# Patient Record
Sex: Female | Born: 1985 | Race: Black or African American | Hispanic: No | Marital: Married | Smoking: Never smoker
Health system: Southern US, Community
[De-identification: ages and names within clinical notes are randomized; demographics above are authoritative.]

## PROBLEM LIST (undated history)

## (undated) DIAGNOSIS — K620 Anal polyp: Secondary | ICD-10-CM

## (undated) DIAGNOSIS — F329 Major depressive disorder, single episode, unspecified: Secondary | ICD-10-CM

## (undated) DIAGNOSIS — K621 Rectal polyp: Secondary | ICD-10-CM

## (undated) DIAGNOSIS — N920 Excessive and frequent menstruation with regular cycle: Secondary | ICD-10-CM

## (undated) DIAGNOSIS — G4700 Insomnia, unspecified: Secondary | ICD-10-CM

## (undated) DIAGNOSIS — IMO0001 Reserved for inherently not codable concepts without codable children: Secondary | ICD-10-CM

## (undated) DIAGNOSIS — D62 Acute posthemorrhagic anemia: Secondary | ICD-10-CM

## (undated) DIAGNOSIS — K635 Polyp of colon: Secondary | ICD-10-CM

## (undated) DIAGNOSIS — N946 Dysmenorrhea, unspecified: Secondary | ICD-10-CM

## (undated) HISTORY — DX: Dysmenorrhea, unspecified: N94.6

## (undated) HISTORY — PX: CERVICAL POLYPECTOMY: SHX88

## (undated) HISTORY — PX: COLONOSCOPY: SHX174

## (undated) HISTORY — DX: Rectal polyp: K62.1

## (undated) HISTORY — PX: TONSILLECTOMY: SUR1361

## (undated) HISTORY — DX: Anal polyp: K62.0

## (undated) HISTORY — DX: Excessive and frequent menstruation with regular cycle: N92.0

## (undated) HISTORY — DX: Insomnia, unspecified: G47.00

---
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
## 2006-04-10 ENCOUNTER — Ambulatory Visit: Payer: Self-pay | Admitting: Family Medicine

## 2006-05-02 ENCOUNTER — Ambulatory Visit: Payer: Self-pay | Admitting: Family Medicine

## 2006-08-07 ENCOUNTER — Ambulatory Visit: Payer: Self-pay | Admitting: Sports Medicine

## 2006-08-26 ENCOUNTER — Emergency Department (HOSPITAL_COMMUNITY): Admission: EM | Admit: 2006-08-26 | Discharge: 2006-08-26 | Payer: Self-pay | Admitting: Emergency Medicine

## 2006-11-06 ENCOUNTER — Ambulatory Visit: Payer: Self-pay | Admitting: Family Medicine

## 2006-12-06 DIAGNOSIS — N946 Dysmenorrhea, unspecified: Secondary | ICD-10-CM | POA: Insufficient documentation

## 2006-12-06 DIAGNOSIS — E663 Overweight: Secondary | ICD-10-CM | POA: Insufficient documentation

## 2006-12-06 DIAGNOSIS — J45909 Unspecified asthma, uncomplicated: Secondary | ICD-10-CM | POA: Insufficient documentation

## 2006-12-06 HISTORY — DX: Dysmenorrhea, unspecified: N94.6

## 2007-02-21 ENCOUNTER — Telehealth: Payer: Self-pay | Admitting: *Deleted

## 2007-02-22 ENCOUNTER — Ambulatory Visit: Payer: Self-pay | Admitting: Family Medicine

## 2007-03-26 ENCOUNTER — Encounter: Payer: Self-pay | Admitting: *Deleted

## 2007-04-17 ENCOUNTER — Ambulatory Visit: Payer: Self-pay | Admitting: Family Medicine

## 2007-07-24 ENCOUNTER — Ambulatory Visit: Payer: Self-pay | Admitting: Family Medicine

## 2007-07-25 ENCOUNTER — Encounter (INDEPENDENT_AMBULATORY_CARE_PROVIDER_SITE_OTHER): Payer: Self-pay | Admitting: Family Medicine

## 2007-07-25 ENCOUNTER — Ambulatory Visit: Payer: Self-pay | Admitting: Internal Medicine

## 2007-07-25 DIAGNOSIS — K620 Anal polyp: Secondary | ICD-10-CM | POA: Insufficient documentation

## 2007-07-25 DIAGNOSIS — K621 Rectal polyp: Secondary | ICD-10-CM

## 2007-07-25 HISTORY — DX: Anal polyp: K62.0

## 2007-07-29 ENCOUNTER — Encounter: Payer: Self-pay | Admitting: *Deleted

## 2007-07-29 LAB — CONVERTED CEMR LAB
BUN: 13 mg/dL (ref 6–23)
Basophils Absolute: 0 10*3/uL (ref 0.0–0.1)
Basophils Relative: 1 % (ref 0–1)
CO2: 25 meq/L (ref 19–32)
Calcium: 9.5 mg/dL (ref 8.4–10.5)
Chloride: 104 meq/L (ref 96–112)
Creatinine, Ser: 0.69 mg/dL (ref 0.40–1.20)
Eosinophils Absolute: 0.1 10*3/uL (ref 0.0–0.7)
Eosinophils Relative: 2 % (ref 0–5)
Glucose, Bld: 79 mg/dL (ref 70–99)
HCT: 39.8 % (ref 36.0–46.0)
Hemoglobin: 13.8 g/dL (ref 12.0–15.0)
Lymphocytes Relative: 36 % (ref 12–46)
Lymphs Abs: 2 10*3/uL (ref 0.7–3.3)
MCHC: 34.7 g/dL (ref 30.0–36.0)
MCV: 90.5 fL (ref 78.0–100.0)
Monocytes Absolute: 0.6 10*3/uL (ref 0.2–0.7)
Monocytes Relative: 11 % (ref 3–11)
Neutro Abs: 2.9 10*3/uL (ref 1.7–7.7)
Neutrophils Relative %: 51 % (ref 43–77)
Platelets: 375 10*3/uL (ref 150–400)
Potassium: 4.2 meq/L (ref 3.5–5.3)
RBC: 4.4 M/uL (ref 3.87–5.11)
RDW: 12.2 % (ref 11.5–14.0)
Sodium: 140 meq/L (ref 135–145)
TSH: 1.006 microintl units/mL (ref 0.350–5.50)
WBC: 5.7 10*3/uL (ref 4.0–10.5)

## 2007-08-02 ENCOUNTER — Telehealth (INDEPENDENT_AMBULATORY_CARE_PROVIDER_SITE_OTHER): Payer: Self-pay | Admitting: Family Medicine

## 2007-09-12 ENCOUNTER — Encounter (INDEPENDENT_AMBULATORY_CARE_PROVIDER_SITE_OTHER): Payer: Self-pay | Admitting: Family Medicine

## 2007-09-12 ENCOUNTER — Ambulatory Visit: Payer: Self-pay | Admitting: Family Medicine

## 2007-10-04 ENCOUNTER — Ambulatory Visit: Payer: Self-pay | Admitting: Family Medicine

## 2007-10-10 ENCOUNTER — Emergency Department (HOSPITAL_COMMUNITY): Admission: EM | Admit: 2007-10-10 | Discharge: 2007-10-10 | Payer: Self-pay | Admitting: *Deleted

## 2007-11-26 ENCOUNTER — Telehealth: Payer: Self-pay | Admitting: *Deleted

## 2007-11-27 ENCOUNTER — Ambulatory Visit: Payer: Self-pay | Admitting: Family Medicine

## 2007-12-20 ENCOUNTER — Telehealth: Payer: Self-pay | Admitting: *Deleted

## 2007-12-25 ENCOUNTER — Ambulatory Visit: Payer: Self-pay | Admitting: Gastroenterology

## 2007-12-26 ENCOUNTER — Encounter (INDEPENDENT_AMBULATORY_CARE_PROVIDER_SITE_OTHER): Payer: Self-pay | Admitting: Family Medicine

## 2007-12-26 ENCOUNTER — Ambulatory Visit: Payer: Self-pay | Admitting: Gastroenterology

## 2008-01-13 ENCOUNTER — Telehealth: Payer: Self-pay | Admitting: *Deleted

## 2008-04-17 ENCOUNTER — Telehealth: Payer: Self-pay | Admitting: *Deleted

## 2008-04-17 ENCOUNTER — Ambulatory Visit: Payer: Self-pay | Admitting: Family Medicine

## 2008-04-17 DIAGNOSIS — L219 Seborrheic dermatitis, unspecified: Secondary | ICD-10-CM | POA: Insufficient documentation

## 2008-04-21 ENCOUNTER — Telehealth: Payer: Self-pay | Admitting: *Deleted

## 2008-04-22 ENCOUNTER — Ambulatory Visit: Payer: Self-pay | Admitting: Family Medicine

## 2008-06-22 ENCOUNTER — Ambulatory Visit: Payer: Self-pay | Admitting: Family Medicine

## 2008-06-25 ENCOUNTER — Telehealth: Payer: Self-pay | Admitting: *Deleted

## 2008-06-29 ENCOUNTER — Ambulatory Visit: Payer: Self-pay | Admitting: Family Medicine

## 2008-10-09 DIAGNOSIS — G4700 Insomnia, unspecified: Secondary | ICD-10-CM

## 2008-10-09 HISTORY — DX: Insomnia, unspecified: G47.00

## 2008-11-18 IMAGING — CR DG CHEST 2V
2 series · 2 of 2 positions shown · non-contrast
Comparison: None available.

CLINICAL DATA: Chest pain. 
 CHEST - 2 VIEW:

[w chest pa]
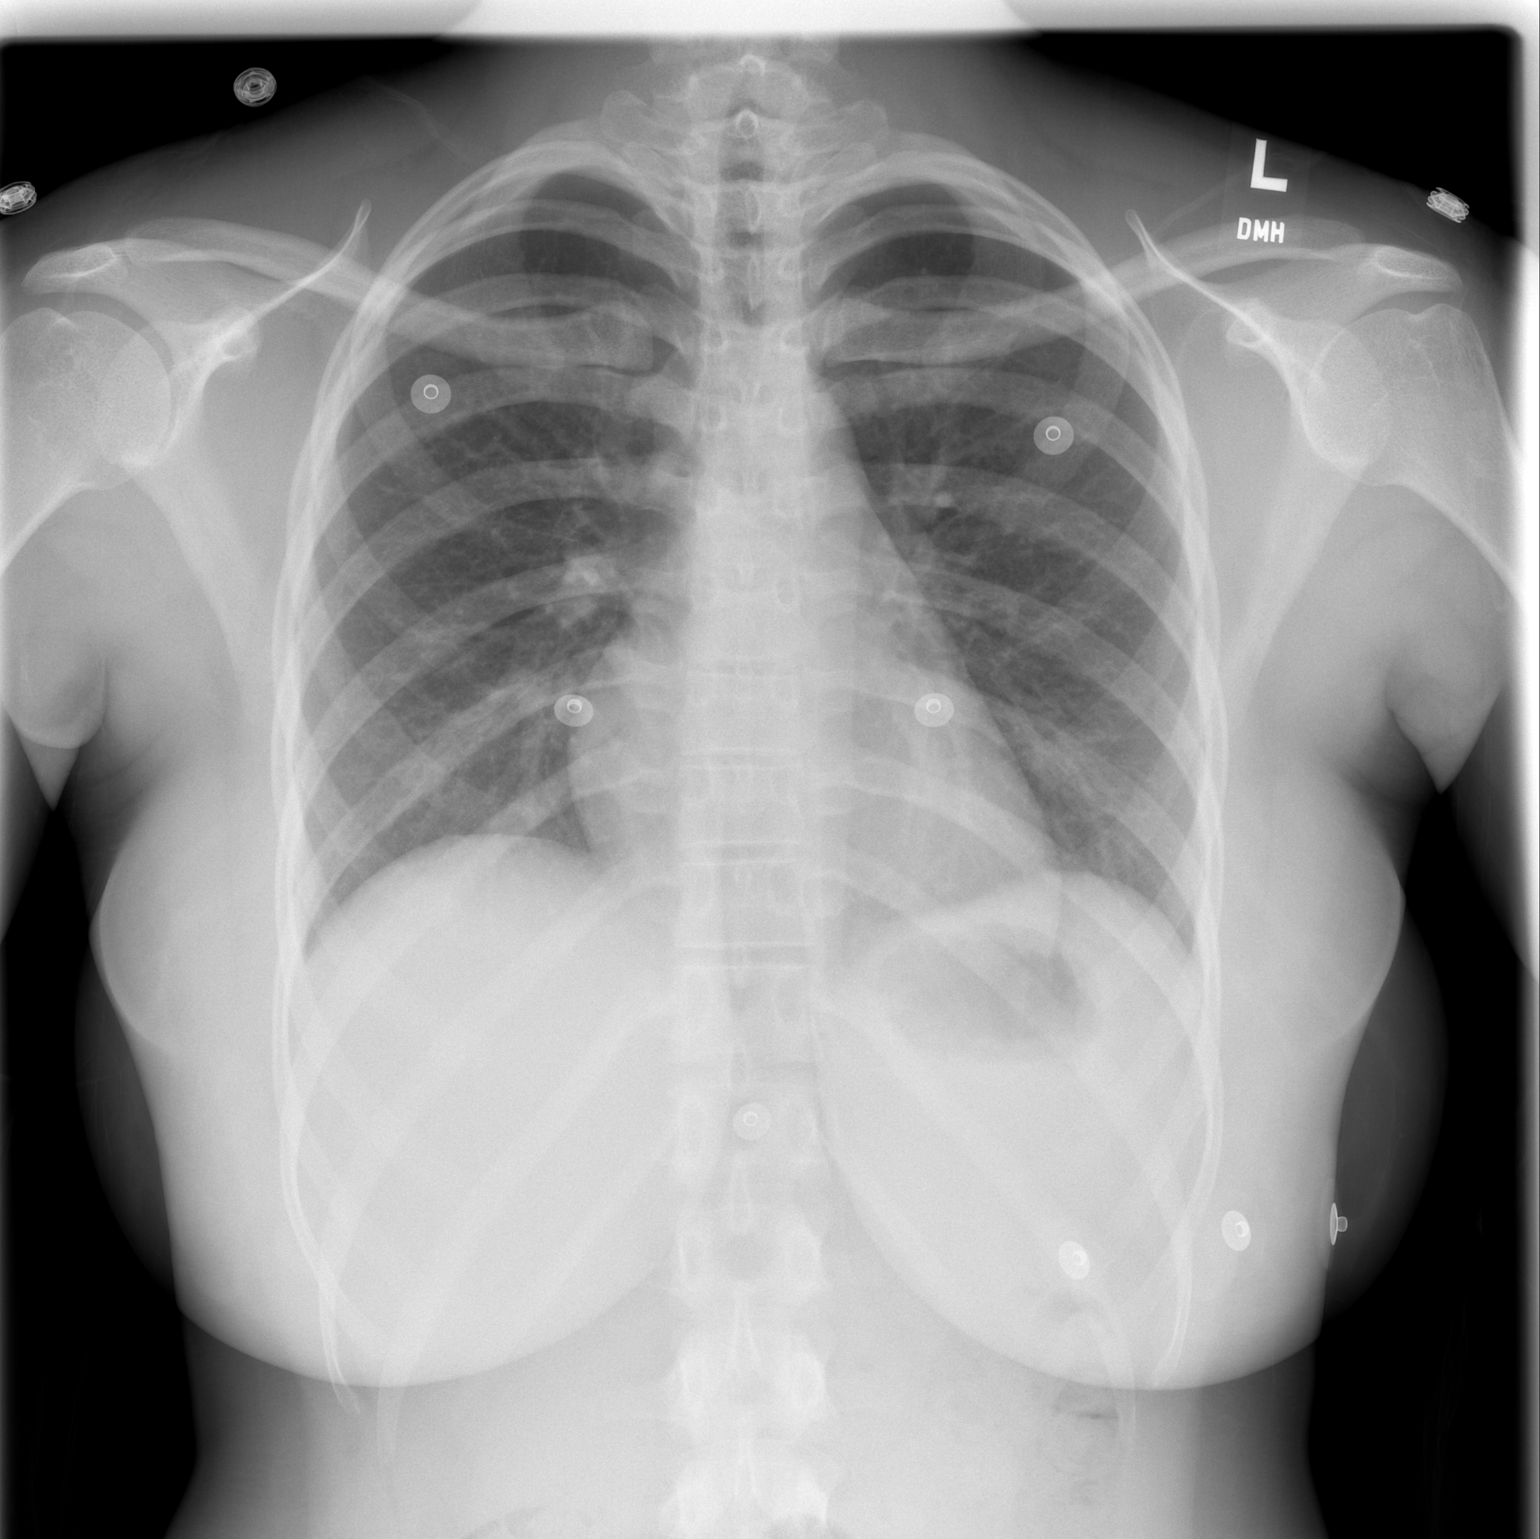

[w chest lat]
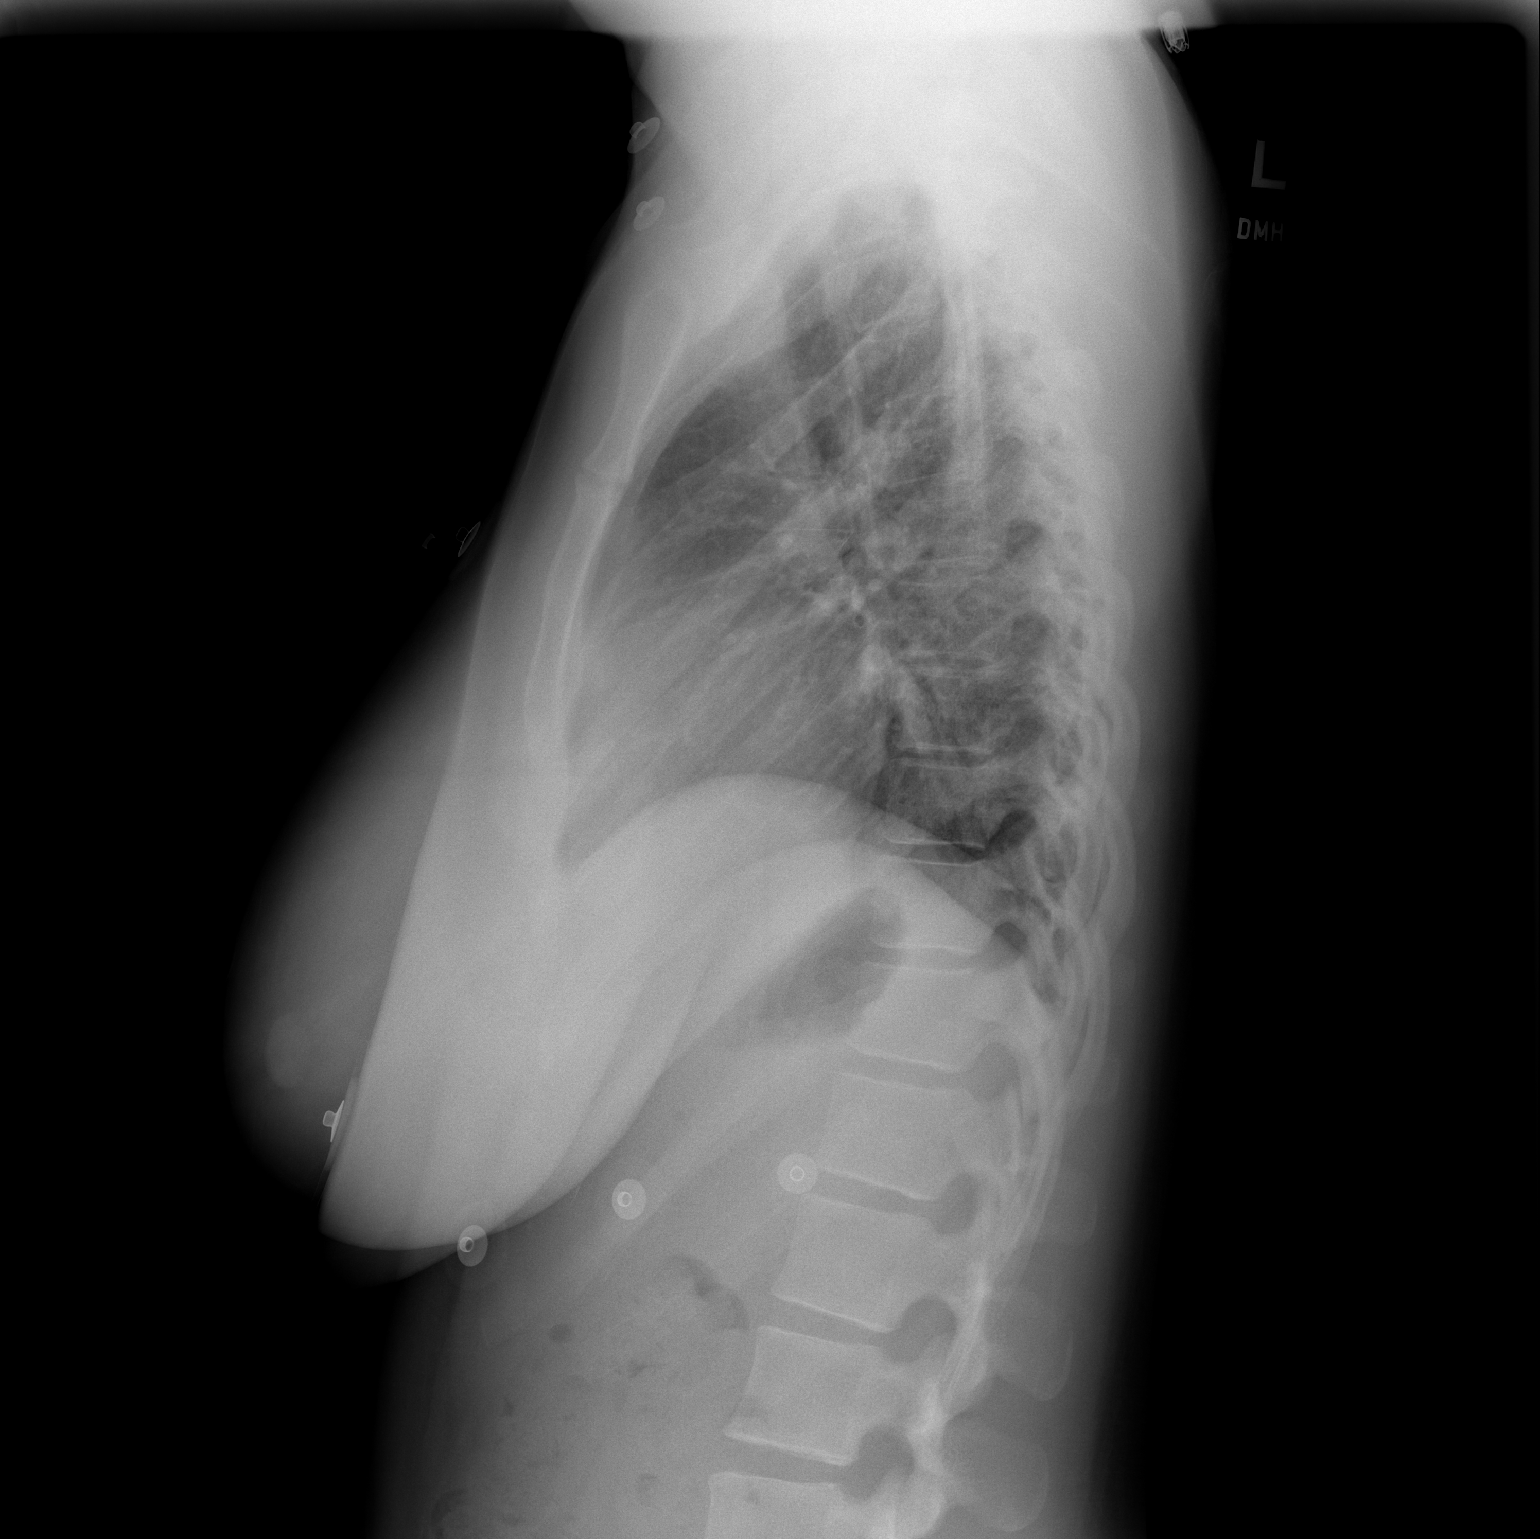

[2 of 2 positions shown; findings below may reference images not displayed]

FINDINGS: Normal mediastinum and cardiac silhouette.  The costophrenic angles are clear.  No evidence of effusion, infiltrate or pneumothorax.  Normal pulmonary vasculature.
IMPRESSION: No acute cardiopulmonary process.

## 2009-03-14 ENCOUNTER — Emergency Department (HOSPITAL_COMMUNITY): Admission: EM | Admit: 2009-03-14 | Discharge: 2009-03-14 | Payer: Self-pay | Admitting: Family Medicine

## 2009-04-05 ENCOUNTER — Encounter (INDEPENDENT_AMBULATORY_CARE_PROVIDER_SITE_OTHER): Payer: Self-pay | Admitting: Family Medicine

## 2009-08-20 ENCOUNTER — Ambulatory Visit: Payer: Self-pay | Admitting: Family Medicine

## 2009-08-20 ENCOUNTER — Encounter: Payer: Self-pay | Admitting: Family Medicine

## 2009-08-20 DIAGNOSIS — F39 Unspecified mood [affective] disorder: Secondary | ICD-10-CM | POA: Insufficient documentation

## 2009-08-20 LAB — CONVERTED CEMR LAB
BUN: 14 mg/dL (ref 6–23)
CO2: 23 meq/L (ref 19–32)
Calcium: 9.1 mg/dL (ref 8.4–10.5)
Chloride: 104 meq/L (ref 96–112)
Creatinine, Ser: 0.6 mg/dL (ref 0.40–1.20)
Glucose, Bld: 81 mg/dL (ref 70–99)
HCT: 39.6 % (ref 36.0–46.0)
Hemoglobin: 13 g/dL (ref 12.0–15.0)
MCHC: 32.8 g/dL (ref 30.0–36.0)
MCV: 93.2 fL (ref 78.0–100.0)
Platelets: 334 10*3/uL (ref 150–400)
Potassium: 3.6 meq/L (ref 3.5–5.3)
RBC: 4.25 M/uL (ref 3.87–5.11)
RDW: 12.6 % (ref 11.5–15.5)
Sodium: 142 meq/L (ref 135–145)
TSH: 0.549 microintl units/mL (ref 0.350–4.500)
Vitamin B-12: 475 pg/mL (ref 211–911)
WBC: 4.9 10*3/uL (ref 4.0–10.5)

## 2009-08-22 ENCOUNTER — Encounter: Payer: Self-pay | Admitting: Family Medicine

## 2009-09-08 ENCOUNTER — Encounter: Payer: Self-pay | Admitting: Family Medicine

## 2009-11-05 ENCOUNTER — Encounter: Payer: Self-pay | Admitting: Family Medicine

## 2009-11-05 ENCOUNTER — Ambulatory Visit: Payer: Self-pay | Admitting: Family Medicine

## 2009-11-08 ENCOUNTER — Ambulatory Visit: Payer: Self-pay | Admitting: Family Medicine

## 2009-11-19 ENCOUNTER — Telehealth: Payer: Self-pay | Admitting: Family Medicine

## 2010-01-13 ENCOUNTER — Ambulatory Visit: Payer: Self-pay | Admitting: Family Medicine

## 2010-01-13 DIAGNOSIS — J301 Allergic rhinitis due to pollen: Secondary | ICD-10-CM

## 2010-03-14 ENCOUNTER — Ambulatory Visit: Payer: Self-pay | Admitting: Family Medicine

## 2010-03-14 ENCOUNTER — Telehealth: Payer: Self-pay | Admitting: Family Medicine

## 2010-03-14 DIAGNOSIS — F329 Major depressive disorder, single episode, unspecified: Secondary | ICD-10-CM

## 2010-04-12 ENCOUNTER — Ambulatory Visit: Payer: Self-pay | Admitting: Family Medicine

## 2010-04-12 ENCOUNTER — Encounter: Payer: Self-pay | Admitting: Family Medicine

## 2010-04-12 DIAGNOSIS — D649 Anemia, unspecified: Secondary | ICD-10-CM

## 2010-04-12 LAB — CONVERTED CEMR LAB
HCT: 38.2 % (ref 36.0–46.0)
Hemoglobin: 12.9 g/dL (ref 12.0–15.0)
MCHC: 33.8 g/dL (ref 30.0–36.0)
MCV: 90.7 fL (ref 78.0–100.0)
Platelets: 349 10*3/uL (ref 150–400)
RBC: 4.21 M/uL (ref 3.87–5.11)
RDW: 13.1 % (ref 11.5–15.5)
TSH: 0.784 microintl units/mL (ref 0.350–4.500)
WBC: 5.7 10*3/uL (ref 4.0–10.5)

## 2010-04-13 ENCOUNTER — Encounter: Payer: Self-pay | Admitting: Family Medicine

## 2010-04-13 ENCOUNTER — Telehealth (INDEPENDENT_AMBULATORY_CARE_PROVIDER_SITE_OTHER): Payer: Self-pay | Admitting: *Deleted

## 2010-04-21 ENCOUNTER — Encounter: Payer: Self-pay | Admitting: Family Medicine

## 2010-04-21 ENCOUNTER — Ambulatory Visit: Payer: Self-pay | Admitting: Family Medicine

## 2010-04-21 DIAGNOSIS — N920 Excessive and frequent menstruation with regular cycle: Secondary | ICD-10-CM

## 2010-04-21 LAB — CONVERTED CEMR LAB
HCT: 38.8 % (ref 36.0–46.0)
Platelets: 359 10*3/uL (ref 150–400)
RDW: 13 % (ref 11.5–15.5)

## 2010-05-12 ENCOUNTER — Telehealth: Payer: Self-pay | Admitting: Family Medicine

## 2010-06-20 ENCOUNTER — Encounter: Payer: Self-pay | Admitting: Family Medicine

## 2010-07-01 ENCOUNTER — Telehealth: Payer: Self-pay | Admitting: *Deleted

## 2010-07-20 ENCOUNTER — Telehealth: Payer: Self-pay | Admitting: Family Medicine

## 2010-07-23 ENCOUNTER — Encounter: Payer: Self-pay | Admitting: Family Medicine

## 2010-11-05 ENCOUNTER — Emergency Department (HOSPITAL_COMMUNITY)
Admission: EM | Admit: 2010-11-05 | Discharge: 2010-11-05 | Payer: Self-pay | Source: Home / Self Care | Admitting: Emergency Medicine

## 2010-11-08 LAB — CULTURE, ROUTINE-ABSCESS

## 2010-11-08 NOTE — Progress Notes (Signed)
Summary: Rx Prob   Phone Note Call from Patient Call back at [facility] Phone [phone]   Caller: Patient Summary of Call: The rx that was sent in yesterday needs to go to [facility] [address] Initial call taken by: Clydell Hakim,  April 13, 2010 11:49 AM  Follow-up for Phone Call        sent to [facility]. tried calling patient but unable to leave message. Follow-up by: Theresia Lo RN,  April 13, 2010 12:02 PM

## 2010-11-08 NOTE — Progress Notes (Signed)
Summary: Rx Req   Phone Note Refill Request Call back at Home Phone [phone] Message from:  Patient  Refills Requested: Medication #1:  VENTOLIN HFA 108 (90 BASE) MCG/ACT AERS 1-2 puffs every 4 hours as needed for wheeze Pt says she also needs Celexa. Would like generic of the Celexa.  Pt uses [facility] [address]  Initial call taken by: Clydell Hakim,  November 19, 2009 9:47 AM  Follow-up for Phone Call        will forward to MD. Follow-up by: Theresia Lo RN,  November 19, 2009 10:25 AM    Prescriptions: VENTOLIN HFA 108 (90 BASE) MCG/ACT AERS (ALBUTEROL SULFATE) 1-2 puffs every 4 hours as needed for wheeze, cough, shortness of breath  #1 x 0   Entered by:   Theresia Lo RN   Authorized by:   Alvia Grove DO   Signed by:   Alvia Grove DO on 11/19/2009   Method used:   Electronically to        [facility]  [address] [phone]* (retail)       [address]       [place], [place]  [address]       Ph: [phone]       Fax: [phone]   RxID:   [id number]  above Rx was sent to [facility] [address] and not [facility] [address] will send message to Dr. Gomez Cleverly about refilling Celexa however as this is not on  patient's med list. Theresia Lo RN  November 19, 2009 10:30 AM   I don't see it on her list either.  If she is on it, who gave it to her?  If she is already on it, then I don't want her to be without, so OK to refill for 1 month until she sees me.  If she is not on it, then she needs to see me before we give it to her.  Ok to double book for next week if she needs appt soon.  Thanks! Kendal Hymen  Appended Document: Rx Req patient states she has been off Celexa for about 9 months. gave her message from MD appointment scheduled with Dr. Gomez Cleverly for 12/07/2009.

## 2010-11-08 NOTE — Miscellaneous (Signed)
Summary: call from pharmacy   Clinical Lists Changes  received call from pharmacist at  [facility] [address] regarding birth control RX that was sent  in July.    patient was given ortho tri cyclen in  instead of ortho cyclen as was prescribed. the pharmacist will discuss this with patient when she comes to pick up her next RX. Theresia Lo RN  June 20, 2010 5:06 PM   noted

## 2010-11-08 NOTE — Assessment & Plan Note (Signed)
Summary: tb test,tcb   Nurse Visit   Allergies: No Known Drug Allergies  Immunizations Administered:  PPD Skin Test:    Vaccine Type: PPD    Site: right forearm    Mfr: Sanofi Pasteur    Dose: 0.1 ml    Route: ID    Given by: Theresia Lo RN    Exp. Date: 02/02/2012    Lot #: [id number]  Orders Added: 1)  TB Skin Test [86580] 2)  Admin 1st Vaccine [phone]

## 2010-11-08 NOTE — Assessment & Plan Note (Signed)
Summary: neck tingling & back pain/Gallia/burnham   Vital Signs:  Patient profile:   25 year old female Weight:      176.2 pounds Pulse rate:   84 / minute BP sitting:   130 / 80  (right arm)  Vitals Entered By: Renato Battles slade,cma CC: left neck pain radiates into left shoulder x 4 days. Is Patient Diabetic? No Pain Assessment Patient in pain? yes     Location: neck/shoulder Intensity: 8 Onset of pain  x 4 days   Primary Care Provider:  Alanda Amass MD  CC:  left neck pain radiates into left shoulder x 4 days.Marland Kitchen  History of Present Illness: Katie Dyer comes in for left sided neck pain and shoulder pain. At end of visit asked for something for depression. 1) neck/shoulder pain.  Woke up with it 4 days ago. Sharp, tingling pain in her neck, down her shoulder blade and into her shoulder and left arm.  Comes and goes.  Twinges that will shoot for seconds.  Normal motion and strength but some movements to cause pain. No weakness.  No fevers.  No trauma 2) Depression - very down.  Not enjoying things she usually enjoys.  Sleepign a lot.  Not happy.  Tried celexa in the past but didn't think it helped.  no SI/HI  Habits & Providers  Alcohol-Tobacco-Diet     Tobacco Status: never  Allergies: No Known Drug Allergies  Physical Exam  General:  Vital signs reviewed Well-developed, well-nourished patient in NAD.  Awake, cooperative.  Neck:  pain with turning head side to side.  No bony tenderness over vetebrae.  TTP over rhomboids, trap and SCM.   Msk:  FROM of left shoulder 5/5 strength in all directions normal IR/ER Psych:  Oriented X3 and memory intact for recent and remote.  fair eye contact.  soft voice.  short answers.  flat aaffect.    Impression & Recommendations:  Problem # 1:  NECK PAIN (ICD-723.1) Assessment New  Flexeril, ibuprofen, neck spasm stretches handout given.  The following medications were removed from the medication list:    Naproxen 500 Mg Tabs (Naproxen)  .Marland Kitchen... 1 tab by mouth two times a day as needed for menstrual pain.  do not take with ibuprofen. Her updated medication list for this problem includes:    Ibuprofen 800 Mg Tabs (Ibuprofen) .Marland Kitchen... Take 1 tablet by mouth three times a day as needed    Flexeril 5 Mg Tabs (Cyclobenzaprine hcl) .Marland Kitchen... 1 tab by mouth q 6 hrs as needed pain or spasm  Orders: [facility]- Est  Level 4 ([address])  Problem # 2:  DEPRESSION (ICD-311) Assessment: New  Will try fluoxetine as more activating.  F/U with pcp to assess effect in 3-4 weeks.   Her updated medication list for this problem includes:    Fluoxetine Hcl 20 Mg Tabs (Fluoxetine hcl) .Marland Kitchen... 1 tab by mouth daily for depression  Orders: [facility]- Est  Level 4 ([address])  Complete Medication List: 1)  Qvar 40 Mcg/act Aers (Beclomethasone dipropionate) .Marland Kitchen.. 1 puff inhaled two times a day for asthma 2)  Ventolin Hfa 108 (90 Base) Mcg/act Aers (Albuterol sulfate) .Marland Kitchen.. 1-2 puffs every 4 hours as needed for wheeze, cough, shortness of breath 3)  Ibuprofen 800 Mg Tabs (Ibuprofen) .... Take 1 tablet by mouth three times a day as needed 4)  Ortho-cyclen (28) 0.25-35 Mg-mcg Tabs (Norgestimate-eth estradiol) .... Take as directed.  disp #1 pack. 5)  Fexofenadine Hcl 180 Mg Tabs (Fexofenadine hcl) .... Take 1  tab in am and pm for allergies 6)  Fluoxetine Hcl 20 Mg Tabs (Fluoxetine hcl) .Marland Kitchen.. 1 tab by mouth daily for depression 7)  Flexeril 5 Mg Tabs (Cyclobenzaprine hcl) .Marland Kitchen.. 1 tab by mouth q 6 hrs as needed pain or spasm  Patient Instructions: 1)  Please follow-up with your primary physician in 3-4 weeks to evaluate how you are doing with your depression. 2)  Use the flexeril (muscle relaxer) and ibuprofen for your neck pain.  3)  Do the neck spasm exercises daily. 4)  If your pain worsens or is not improving in the next 1-2 weeks, please return.  Prescriptions: FLEXERIL 5 MG TABS (CYCLOBENZAPRINE HCL) 1 tab by mouth q 6 hrs as needed pain or spasm  #30 x 3   Entered and  Authorized by:   Ardeen Garland  MD   Signed by:   Ardeen Garland  MD on 03/14/2010   Method used:   Print then Give to Patient   RxID:   [id number] IBUPROFEN 800 MG TABS (IBUPROFEN) Take 1 tablet by mouth three times a day as needed  #90 x 0   Entered and Authorized by:   Ardeen Garland  MD   Signed by:   Ardeen Garland  MD on 03/14/2010   Method used:   Print then Give to Patient   RxID:   [id number] FLUOXETINE HCL 20 MG TABS (FLUOXETINE HCL) 1 tab by mouth daily for depression  #33 x 3   Entered and Authorized by:   Ardeen Garland  MD   Signed by:   Ardeen Garland  MD on 03/14/2010   Method used:   Print then Give to Patient   RxID:   [id number]

## 2010-11-08 NOTE — Miscellaneous (Signed)
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

## 2010-11-08 NOTE — Progress Notes (Signed)
Summary: phn msg   Phone Note Call from Patient Call back at Home Phone [phone]   Caller: Patient Summary of Call: pt was on her period all last week and it stopped 2 days ago  - today she started again - she is on Sprintec pls advise Initial call taken by: De Nurse,  July 20, 2010 12:26 PM  Follow-up for Phone Call        she will see her pcp friday to discuss her periods Follow-up by: Golden Circle RN,  July 20, 2010 12:32 PM

## 2010-11-08 NOTE — Progress Notes (Signed)
Summary: triage   Phone Note Call from Patient Call back at Home Phone [phone]   Caller: Patient Summary of Call: Has question about starting her cycle and taking her pills. Initial call taken by: Clydell Hakim,  May 12, 2010 9:39 AM  Follow-up for Phone Call        states she took the ocps as directed with decreasing doses. to start new pack with one daily. she is spotting. told her to start the new pack of pills sunday & take at same time every day to avoid breakthru bleeding or pregnancy. she verbalized understanding Follow-up by: Golden Circle RN,  May 12, 2010 9:41 AM  Additional Follow-up for Phone Call Additional follow up Details #1::        agree. thanks. Additional Follow-up by: Ancil Boozer  MD,  May 12, 2010 9:58 AM

## 2010-11-08 NOTE — Progress Notes (Signed)
Summary: refill   Phone Note Refill Request Call back at Home Phone [phone] Message from:  Patient  Refills Requested: Medication #1:  ORTHO-CYCLEN (28) 0.25-35 MG-MCG TABS Take as directed.  Disp #1 pack. [facility] [address] pt is out  Initial call taken by: De Nurse,  July 01, 2010 1:35 PM    Prescriptions: ORTHO-CYCLEN (28) 0.25-35 MG-MCG TABS (NORGESTIMATE-ETH ESTRADIOL) Take as directed.  Disp #1 pack.  #1 x 2   Entered by:   Golden Circle RN   Authorized by:   Alvia Grove DO   Signed by:   Golden Circle RN on 07/01/2010   Method used:   Electronically to        [facility] [phone]* (retail)       [address]       [place], [place]  [address]       Ph: [phone]       Fax: [phone]   RxID:   [id number]

## 2010-11-08 NOTE — Assessment & Plan Note (Signed)
Summary: excess menses/eo   Vital Signs:  Patient profile:   25 year old female Height:      67 inches Weight:      177 pounds BMI:     27.82 BSA:     1.92 Temp:     98.1 degrees F Pulse rate:   61 / minute BP sitting:   135 / 86  Vitals Entered By: Jone Baseman CMA (April 21, 2010 11:04 AM) CC: excess bleeding x 9 days Is Patient Diabetic? No Pain Assessment Patient in pain? no        Primary Care Provider:  Alvia Grove DO  CC:  excess bleeding x 9 days.  History of Present Illness: menstrual bleeding: patient reports first episode of excess menses that she's ever had.  she has had a history of painful periods for which she was started on OCPs in November of this year.  since then she's had regular monthly periods.  typical period was 3-4 days with 1st 1-2 days being heavies.  this time she reports she's had clots, cramping and period is getting heavier.  she is changing her pad up to every hour.  she has been taking her OCPs as prescrbied.  she denies trauma to the area and has never been sexually active.  she denies fevers  Habits & Providers  Alcohol-Tobacco-Diet     Tobacco Status: never  Current Medications (verified): 1)  Qvar 40 Mcg/act Aers (Beclomethasone Dipropionate) .Marland Kitchen.. 1 Puff Inhaled Two Times A Day For Asthma 2)  Ventolin Hfa 108 (90 Base) Mcg/act Aers (Albuterol Sulfate) .Marland Kitchen.. 1-2 Puffs Every 4 Hours As Needed For Wheeze, Cough, Shortness of Breath 3)  Ibuprofen 800 Mg Tabs (Ibuprofen) .... Take 1 Tablet By Mouth Three Times A Day As Needed 4)  Ortho-Cyclen (28) 0.25-35 Mg-Mcg Tabs (Norgestimate-Eth Estradiol) .... Take As Directed.  Disp #1 Pack. 5)  Fexofenadine Hcl 180 Mg Tabs (Fexofenadine Hcl) .... Take 1 Tab in Am and Pm For Allergies 6)  Ortho-Cyclen (28) 0.25-35 Mg-Mcg Tabs (Norgestimate-Eth Estradiol) .... Take 4 Pills On Day One, 3 On Day 2, 2 On Day 3 and 1/day For 1 Week Then Stop For One Week. 7)  Promethazine Hcl 25 Mg Tabs (Promethazine  Hcl) .... 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  Homeschooled since Grade 3.  Works at [employer].  Rare EtOH.  No smoking nor alcohol use. Never sexually active (confirmed today)  Review of Systems       per HPI  Physical Exam  General:  VS reviewed, alert, well-developed, well-nourished, and well-hydrated.   VS WNL Genitalia:  normal introitus and no external lesions.  vaginal bleeding noted around introitous but not dripping from vaginal vault.  mucosa pink and moist.  patient unable to tolerate speculum examination even with smallest speculum here.  internal examination with just one finger reveals blood in vaginal vault.  no uterine masses or adenexal masses appreciated.    Impression & Recommendations:  Problem # 1:  EXCESSIVE MENSTRUAL BLEEDING (ICD-626.2) Assessment New  see pt instructions uncertain etiology if recurs could consider TV ultrasound but I am unsure if she would be able to tolerate it at this point check hgb today to make sure stable but I  suspect it will be based on pulse of 61,   Her updated medication list for this problem includes:    Ortho-cyclen (28) 0.25-35 Mg-mcg Tabs (Norgestimate-eth estradiol) .Marland Kitchen... Take as directed.  disp #1 pack.    Ortho-cyclen (28) 0.25-35 Mg-mcg Tabs (Norgestimate-eth estradiol) .Marland Kitchen... Take 4 pills on day one, 3 on day 2, 2 on day 3 and 1/day for 1 week then stop for one week.  Orders: [facility]- Est  Level 4 (409[address])  Complete Medication List: 1)  Qvar 40 Mcg/act Aers (Beclomethasone dipropionate) .Marland Kitchen.. 1 puff inhaled two times a day for asthma 2)  Ventolin Hfa 108 (90  Base) Mcg/act Aers (Albuterol sulfate) .Marland Kitchen.. 1-2 puffs every 4 hours as needed for wheeze, cough, shortness of breath 3)  Ibuprofen 800 Mg Tabs (Ibuprofen) .... Take 1 tablet by mouth three times a day as needed 4)  Ortho-cyclen (28) 0.25-35 Mg-mcg Tabs (Norgestimate-eth estradiol) .... Take as directed.  disp #1 pack. 5)  Fexofenadine Hcl 180 Mg Tabs (Fexofenadine hcl) .... Take 1 tab in am and pm for allergies 6)  Ortho-cyclen (28) 0.25-35 Mg-mcg Tabs (Norgestimate-eth estradiol) .... Take 4 pills on day one, 3 on day 2, 2 on day 3 and 1/day for 1 week then stop for one week. 7)  Promethazine Hcl 25 Mg Tabs (Promethazine hcl) .... 1/2 - 1 by mouth three times a day as needed nausea  Other Orders: [facility] ([address])  Patient Instructions: 1)  It is hard to know why this cycle has been so heavy and long but we can get it stopped -  2)  Pick up the extra pack of birth control pills I have written you for - take 4 pills on day one, 3 on day 2, 2 on day 3 and then 1 pill daily for 1 week.  Then stop your pills for 1 week - you will have another period at this time.  After this week then resume how you have been taking your birth control pills.  3)  IF you bleeding doesn't at least slow down in the next day or two with this please let us know.  4)  You may get nauseous with this regimen - I have given you a prescription for a nausea medicine - it will make you sleepy so be careful how you use it.  5)  You can use ibuprofen for discomfort or pain. It may actually help slow down the bleeding as well. Prescriptions: PROMETHAZINE HCL 25 MG TABS (PROMETHAZINE HCL) 1/2 - 1 by mouth three times a day as needed nausea  #20 x 0   Entered and Authorized by:   Ancil Boozer  MD   Signed by:   Ancil Boozer  MD on 04/21/2010   Method used:   Handwritten   RxID:   [id number] ORTHO-CYCLEN (28) 0.25-35 MG-MCG TABS (NORGESTIMATE-ETH ESTRADIOL) take 4 pills on day one, 3 on day 2, 2 on day 3 and 1/day for 1 week  then stop for one week.  #1 x 0   Entered and Authorized by:   Ancil Boozer  MD   Signed by:   Ancil Boozer  MD on 04/21/2010   Method used:   Handwritten   RxID:   [id number]

## 2010-11-08 NOTE — Miscellaneous (Signed)
Summary: ROI  ROI   Imported By: Knox Royalty 11/16/2009 10:38:56  _____________________________________________________________________  External Attachment:    Type:   Image     Comment:   External Document

## 2010-11-08 NOTE — Progress Notes (Signed)
Summary: triage   Phone Note Call from Patient Call back at Home Phone [phone]   Caller: Patient Summary of Call: Pt has neck and back pain and wondering if she can be seen today? Initial call taken by: Clydell Hakim,  March 14, 2010 9:59 AM  Follow-up for Phone Call        hx of back pain. neck tinglesand shoulder hurts. started 4 days ago.  denied unusual activity or trauma.  asked her to come in now. aware there will be a wait Follow-up by: Golden Circle RN,  March 14, 2010 10:05 AM

## 2010-11-08 NOTE — Assessment & Plan Note (Signed)
Summary: read tb/eo   Nurse Visit   Allergies: No Known Drug Allergies  PPD Results    Date of reading: 11/08/2009    Results: < 5mm    Interpretation: negative  Orders Added: 1)  No Charge Patient Arrived (NCPA0) [NCPA0]

## 2010-11-08 NOTE — Letter (Signed)
Summary: Generic Letter  [facility]  [address]   [place], [place] [address]   Phone: [phone]  Fax: [phone]    04/13/2010  Katie Dyer [address] [place], [place]  [address]  Dear Ms. BENTON,     I have reviewed your lab work and everything looks wonderful!  Please call the office if you have any questions.   Sincerely,   Alvia Grove DO  Appended Document: Generic Letter mailed

## 2010-11-08 NOTE — Assessment & Plan Note (Signed)
Summary: congestion/cold/eo   Vital Signs:  Patient profile:   25 year old female Height:      67 inches Weight:      174 pounds BMI:     27.35 BSA:     1.91 Temp:     99.1 degrees F Pulse rate:   92 / minute BP sitting:   127 / 86  Vitals Entered By: Jone Baseman CMA (January 13, 2010 3:16 PM) CC: congestion x 3 weeks Is Patient Diabetic? No Pain Assessment Patient in pain? no        Primary Care Provider:  JENNIFER HOBBS MD  CC:  congestion x 3 weeks.  History of Present Illness: Patient complaining of cough and chest congestion for past 3 weeks.  Initially had cough that lasted for about 1 week, better few days, then returned much worse.  Since then cough that has been continual throughout the day and night, waking her at night.  Has tried OTC Alka-seltzer and off-brand Benadryl without relief.  Initally cough was clear phlegm, for past 2 weeks she has been coughing up progressively more green mucus.  Has increased Albuterol use to every other day without relief.    ROS:  sore throat, pleuritic pain when coughs but otherwise no chest pain.  No fevers, chills, nausea, vomiting, night sweats.  Some rhinorrhea, profuse watery ocular drainage with some itching of eyes.    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 1 Puff Inhaled Two Times A Day For Asthma 2)  Ventolin Hfa 108 (90 Base) Mcg/act Aers (Albuterol Sulfate) .Marland Kitchen.. 1-2 Puffs Every 4 Hours As Needed For Wheeze, Cough, Shortness of Breath 3)  Ibuprofen 800 Mg Tabs (Ibuprofen) .... Take 1 Tablet  By Mouth Three Times A Day As Needed 4)  Naproxen 500 Mg Tabs (Naproxen) .Marland Kitchen.. 1 Tab By Mouth Two Times A Day As Needed For Menstrual Pain.  Do Not Take With Ibuprofen. 5)  Nizoral 2 % Sham (Ketoconazole) .... Apply For 5-10 Minutes Twice Weekly Then Rinse.  Disp Qs X1 Month. 6)  Ketoconazole 2 % Crea (Ketoconazole) .... Apply To Face Two Times A Day X4 Weeks.  Disp Qs X1 Month. 7)  Ortho-Cyclen (28) 0.25-35 Mg-Mcg Tabs (Norgestimate-Eth Estradiol) .... Take As Directed.  Disp #1 Pack. 8)  Azithromycin 500 Mg Tabs (Azithromycin) .... Take 1 Tab Daily For 5 Days 9)  Fexofenadine Hcl 180 Mg Tabs (Fexofenadine Hcl) .... Take 1 Tab in Am and Pm For Allergies  Allergies (verified): No Known Drug Allergies  Physical Exam  General:  Vital signs reviewed Well-developed, well-nourished patient in NAD.  Awake, cooperative.  Eyes:  clear drainage noted bilaterally.  No conjunctival injection.  pupils equal, pupils round, and pupils reactive to light.   Ears:  tympanic membranes clear Nose:  mildly boggy nasal turbinates.  No drainage noted Mouth:  Oral mucosa moist.  Some mild erythema noted in oropharynx.  Tonsils not swollen Neck:  no lymphadenopathy noted Lungs:  clear to auscultation, no crackles or decreased broeath sounds noted.  Some wheezing when patient asked to cough.   Heart:  RRR  with Grade II/VI SEM Skin:  no rash noted Psych:  mildly anxious appearing.  Denied anxiety or nervousness.  normally interactive and good eye contact.     Impression & Recommendations:  Problem # 1:  URI (ICD-465.9)  Feel this is most likely URI based on symptoms.  Plan to start patient on once daily Azithomycin due to symptoms.  Do not feel this is pneumonia as vital signs good, lungs clear.  Most probably a soft call regarding bacterial lung infection, but as patient initally improved and has since then continued to worsen, possibly bacterial over viral infection, especially in light of production of green  mucus that has been getting worse for past several weeks.  TOld her that albuterol might be contributing to faster heart rate and could possibly increase her anxiety, though she denied any anxiety symptoms.  Gave red flags and reasons to return.   Her updated medication list for this problem includes:    Ibuprofen 800 Mg Tabs (Ibuprofen) .Marland Kitchen... Take 1 tablet by mouth three times a day as needed    Naproxen 500 Mg Tabs (Naproxen) .Marland Kitchen... 1 tab by mouth two times a day as needed for menstrual pain.  do not take with ibuprofen.    Fexofenadine Hcl 180 Mg Tabs (Fexofenadine hcl) .Marland Kitchen... Take 1 tab in am and pm for allergies  Orders: [facility]- Est Level  3 ([address]109)  Problem # 2:  ALLERGIC RHINITIS, SEASONAL (ICD-477.0)  Started patient on Fexofenadine today for allergic rhinitis, ocular drainage.  No signs or symptoms of viral or bacterial conjunctivitis.  In light of watery drainage, boggy turbinates she has recurrence of seasonal allergies.  Has been on Claritin, Zyrtec, and Allegra when younger, all helped.  Discussed red flags, including if she develops signs or symptoms of bacterial conjunctivitis.    Orders: [facility]- Est Level  3 (60454)  Complete Medication List: 1)  Qvar 40 Mcg/act Aers (Beclomethasone dipropionate) .Marland Kitchen.. 1 puff inhaled two times a day for asthma 2)  Ventolin Hfa 108 (90 Base) Mcg/act Aers (Albuterol sulfate) .Marland Kitchen.. 1-2 puffs every 4 hours as needed for wheeze, cough, shortness of breath 3)  Ibuprofen 800 Mg Tabs (Ibuprofen) .... Take 1 tablet by mouth three times a day as needed 4)  Naproxen 500 Mg Tabs (Naproxen) .Marland Kitchen.. 1 tab by mouth two times a day as needed for menstrual pain.  do not take with ibuprofen. 5)  Nizoral 2 % Sham (Ketoconazole) .... Apply for 5-10 minutes twice weekly then rinse.  disp qs x1 month. 6)  Ketoconazole 2 % Crea (Ketoconazole) .... Apply to face two times a day x4 weeks.  disp qs x1 month. 7)  Ortho-cyclen (28) 0.25-35 Mg-mcg Tabs (Norgestimate-eth estradiol) ....  Take as directed.  disp #1 pack. 8)  Azithromycin 500 Mg Tabs (Azithromycin) .... Take 1 tab daily for 5 days 9)  Fexofenadine Hcl 180 Mg Tabs (Fexofenadine hcl) .... Take 1 tab in am and pm for allergies  Patient Instructions: 1)  Try the Azithromycin (antibiotic) for 5 days.   2)  Your lungs sound good, so the Albuterol probably won't help and actually might make you more anxious.   3)  The Allegra should help with the runny eyes and runny nose typical of allergy symptoms.   4)  If you have any fevers, chills, worsening of your cough, or start coughing up blood, please return to our clinic or go to ED.   5)  If you're not feeling better in 2 weeks, please come  back.   Prescriptions: NAPROXEN 500 MG TABS (NAPROXEN) 1 tab by mouth two times a day as needed for menstrual pain.  Do not take with Ibuprofen.  #60 x 1   Entered and Authorized by:   Renold Don MD   Signed by:   Renold Don MD on 01/13/2010   Method used:   Electronically to        [facility] [phone]* (retail)       [address]       [place], [place]  [address]       Ph: [phone]       Fax: [phone]   RxID:   [id number] FEXOFENADINE HCL 180 MG TABS (FEXOFENADINE HCL) Take 1 tab in AM and PM for allergies  #31 x 1   Entered and Authorized by:   Renold Don MD   Signed by:   Renold Don MD on 01/13/2010   Method used:   Electronically to        [facility] [phone]* (retail)       [address]       [place], [place]  [address]       Ph: [phone]       Fax: [phone]   RxID:   [id number] AZITHROMYCIN 500 MG TABS (AZITHROMYCIN) Take 1 tab daily for 5 days  #5 x 0   Entered and Authorized by:   Renold Don MD   Signed by:   Renold Don MD on 01/13/2010   Method used:   Electronically to        [facility] [phone]* (retail)       [address]       [place], [place]  [address]       Ph: [phone]       Fax: [phone]   RxID:   [id number]

## 2010-11-08 NOTE — Assessment & Plan Note (Signed)
Summary: f/u,df   Vital Signs:  Patient profile:   25 year old female Height:      67 inches Weight:      176 pounds BMI:     27.67 BSA:     1.92 Temp:     98.4 degrees F Pulse rate:   88 / minute BP sitting:   132 / 81  Vitals Entered By: Jone Baseman CMA (April 12, 2010 2:59 PM) CC: f/u prozac Is Patient Diabetic? No Pain Assessment Patient in pain? no        Primary Care Provider:  Alvia Grove DO  CC:  f/u prozac.  History of Present Illness: 25 yo female here for f/u depression: this is my first visit with this pt. 1. Depression: per pt report, present for 3 years.  Has tried anti-depressent's in the past, most recently prozac started at last visit.  Does not notice any improvement of symptoms, but has only been taking medicine for 3 weeks at dose of 20mg .   States she often feels down and uninterested in life.  Feels this way about every 3-4 days and states feeling  lasts about 3-4 days then resolves.  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  Lives with her grandmother at home and feels she has good support.  Goes to church twice a week and feels connected with people at church.   Has episodic crying and saddness that is unpredictable.  No SI/HI. No association with menstration nor BC pills (recently started in 08/2009). No family hx of depression or suicide.  Habits & Providers  Alcohol-Tobacco-Diet     Alcohol drinks/day: 0     Tobacco Status: never  Current Problems (verified): 1)  Anemia, Mild, Hx of  (ICD-V12.3) 2)  Depression  (ICD-311) 3)  Allergic Rhinitis, Seasonal  (ICD-477.0) 4)  Uri  (ICD-465.9) 5)  Special Screening For Malignant Neoplasms Colon  (ICD-V76.51) 6)  Mood Disorder  (ICD-296.90) 7)  Asthma, Unspecified  (ICD-493.90) 8)  Menstruation, Painful  (ICD-625.3) 9)  Dermatitis, Seborrheic  (ICD-690.10) 10)  Overweight  (ICD-278.02) 11)  Rectal Polyps  (ICD-569.0)  Current Medications (verified): 1)  Qvar 40 Mcg/act Aers  (Beclomethasone Dipropionate) .Marland Kitchen.. 1 Puff Inhaled Two Times A Day For Asthma 2)  Ventolin Hfa 108 (90 Base) Mcg/act Aers (Albuterol Sulfate) .Marland Kitchen.. 1-2 Puffs Every 4 Hours As Needed For Wheeze, Cough, Shortness of Breath 3)  Ibuprofen 800 Mg Tabs (Ibuprofen) .... Take 1 Tablet By Mouth Three Times A Day As Needed 4)  Ortho-Cyclen (28) 0.25-35 Mg-Mcg Tabs (Norgestimate-Eth Estradiol) .... Take As Directed.  Disp #1 Pack. 5)  Fexofenadine Hcl 180 Mg Tabs (Fexofenadine Hcl) .... Take 1 Tab in Am and Pm For Allergies 6)  Fluoxetine Hcl 20 Mg Tabs (Fluoxetine Hcl) .Marland Kitchen.. 1 Tab By Mouth Daily For Depression 7)  Flexeril 5 Mg Tabs (Cyclobenzaprine Hcl) .Marland Kitchen.. 1 Tab By Mouth Q 6 Hrs As Needed Pain or Spasm  Allergies (verified): No Known Drug Allergies  Past History:  Past Medical History: Anemia-NOS Depression  Physical Exam  General:  VS reviewed, alert, well-developed, well-nourished, and well-hydrated.   Lungs:  normal respiratory effort, normal breath sounds, and no wheezes.   Heart:  RRR with Grade II/VI SEM Abdomen:  soft, non-tender, and normal bowel sounds.   Neurologic:  alert & oriented X3.   Psych:  Oriented X3, good eye contact, not anxious appearing, not agitated, not suicidal, not homicidal, and subdued.     Impression & Recommendations:  Problem # 1:  DEPRESSION (ICD-311) Assessment Unchanged Will increase prozac dose (see pt instructions) Encouraged pt to continue medicine and to give it at least 6 weeks to see any changes. Not interested in couseling at this time.  family hx of hypothyroid, will check tsh today Her updated medication list for this problem includes:    Fluoxetine Hcl 20 Mg Tabs (Fluoxetine hcl) .Marland Kitchen... 1 tab by mouth daily for depression  Orders: TSH-[facility] ([address]-60454) [facility]- Est  Level 4 (09811)  Problem # 2:  ANEMIA, MILD, HX OF (ICD-V12.3) Assessment: Unchanged check cbc today Orders: CBC-[facility] (91478) [facility]- Est  Level 4 (29562)  Problem # 3:   MENSTRUATION, PAINFUL (ICD-625.3) Assessment: Improved  may consider changing to ortho tri-low dose at next visit.   Orders: [facility]- Est  Level 4 (13086)  Complete Medication List: 1)  Qvar 40 Mcg/act Aers (Beclomethasone dipropionate) .Marland Kitchen.. 1 puff inhaled two times a day for asthma 2)  Ventolin Hfa 108 (90 Base) Mcg/act Aers (Albuterol sulfate) .Marland Kitchen.. 1-2 puffs every 4 hours as needed for wheeze, cough, shortness of breath 3)  Ibuprofen 800 Mg Tabs (Ibuprofen) .... Take 1 tablet by mouth three times a day as needed 4)  Ortho-cyclen (28) 0.25-35 Mg-mcg Tabs (Norgestimate-eth estradiol) .... Take as directed.  disp #1 pack. 5)  Fexofenadine Hcl 180 Mg Tabs (Fexofenadine hcl) .... Take 1 tab in am and pm for allergies 6)  Fluoxetine Hcl 20 Mg Tabs (Fluoxetine hcl) .Marland Kitchen.. 1 tab by mouth daily for depression 7)  Flexeril 5 Mg Tabs (Cyclobenzaprine hcl) .Marland Kitchen.. 1 tab by mouth q 6 hrs as needed pain or spasm  Patient Instructions: 1)  Nice to meet you today! 2)  I sent your script to [facility].   3)  Start taking 2 pills daily x 2weeks, then increase to 3 pills daily for 2 weeks. 4)  I will call you if your blood work is abnormal 5)  Please schedule a follow-up appointment in 1 month.  Prescriptions: FLUOXETINE HCL 20 MG TABS (FLUOXETINE HCL) 1 tab by mouth daily for depression  #60 x 3   Entered and Authorized by:   Alvia Grove DO   Signed by:   Alvia Grove DO on 04/12/2010   Method used:   Electronically to        [facility]  [address] [phone]* (retail)       [address]       [place], [place]  [address]       Ph: [phone]       Fax: [phone]   RxID:   [id number]   Appended Document: f/u,df above RX cancelled at [facility] and sent in electronically to [facility] [address]

## 2011-01-02 ENCOUNTER — Inpatient Hospital Stay (INDEPENDENT_AMBULATORY_CARE_PROVIDER_SITE_OTHER)
Admission: RE | Admit: 2011-01-02 | Discharge: 2011-01-02 | Disposition: A | Discharge status: Home / Self Care | Payer: Self-pay | Source: Ambulatory Visit | Attending: Family Medicine | Admitting: Family Medicine

## 2011-01-02 DIAGNOSIS — L989 Disorder of the skin and subcutaneous tissue, unspecified: Secondary | ICD-10-CM

## 2011-02-09 ENCOUNTER — Ambulatory Visit (INDEPENDENT_AMBULATORY_CARE_PROVIDER_SITE_OTHER): Payer: Self-pay | Admitting: Family Medicine

## 2011-02-09 ENCOUNTER — Encounter: Payer: Self-pay | Admitting: Family Medicine

## 2011-02-09 VITALS — BP 131/86 | HR 88 | Temp 98.5°F | Ht 67.0 in | Wt 194.0 lb

## 2011-02-09 DIAGNOSIS — J45909 Unspecified asthma, uncomplicated: Secondary | ICD-10-CM

## 2011-02-09 DIAGNOSIS — J301 Allergic rhinitis due to pollen: Secondary | ICD-10-CM

## 2011-02-09 MED ORDER — BECLOMETHASONE DIPROPIONATE 40 MCG/ACT IN AERS: 2.0000 | INHALATION_SPRAY | Freq: Two times a day (BID) | RESPIRATORY_TRACT | Status: DC

## 2011-02-09 MED ORDER — ALBUTEROL SULFATE HFA 108 (90 BASE) MCG/ACT IN AERS: 2.0000 | INHALATION_SPRAY | RESPIRATORY_TRACT | Status: DC | PRN

## 2011-02-09 MED ORDER — ALBUTEROL SULFATE (2.5 MG/3ML) 0.083% IN NEBU
2.5000 mg | INHALATION_SOLUTION | Freq: Once | RESPIRATORY_TRACT | Status: AC
  Administered 2011-02-09: 2.5 mg via RESPIRATORY_TRACT

## 2011-02-09 MED ORDER — PREDNISONE 50 MG PO TABS: 50.0000 mg | ORAL_TABLET | Freq: Every day | ORAL | Status: AC

## 2011-02-09 MED ORDER — CETIRIZINE HCL 10 MG PO CHEW: 10.0000 mg | CHEWABLE_TABLET | Freq: Every day | ORAL | Status: DC

## 2011-02-09 NOTE — Patient Instructions (Signed)
Nice to see you I am sorry you are not feeling good. I want you to use your inhale every four hours not matter what for the next 24 hours I want you to take prednisone for the next five days Start your QVAR again and zyrtec again. I want to see you again when you are feeling better for a well women exam.

## 2011-02-09 NOTE — Assessment & Plan Note (Signed)
Restart antihistamine, chose zyrtec at this time.

## 2011-02-09 NOTE — Progress Notes (Signed)
  Subjective:    Patient ID: Katie Dyer, female    DOB: [birth year], 24 y.o.   MRN: [id number]  HPI Pt was coming for a well women exam but has had a cough, non productive for the last 3 days, needed to use her inhaler today for the first time in a long time.  Pt is no longer taking her QVAR or allergy medications. Pt Denies fever, chills, nausea vomiting abdominal pain, dysuria, chest pain, or numbness in extremities Pt though has felt like her throat has closed up some and that something is always in it. Pt has been a little SOB as well.      Review of Systems See above.     Objective:   Physical Exam Gen:  NAD hoarse voice EOMI, MMM +PND, mild enlargement of tonsils Pul: mild expiratory wheeze throughout. No rhonchi or rales noticed.  Abd: BS+, NT, ND Ext: no edema, NVI     Pmhx, psh, sh reviewed and no changes.   Assessment & Plan:

## 2011-02-09 NOTE — Assessment & Plan Note (Signed)
Pt given albuterol, made improvement, given peak flow, pt going to do prednisone, start QVAR again, told to start zyrtec as well. Gave pt red flags to look out and when to seek medical attention.

## 2011-02-21 NOTE — Assessment & Plan Note (Signed)
[facility]                         GASTROENTEROLOGY OFFICE NOTE   Katie Dyer, Katie Dyer                      MRN:          [id number]  DATE:12/25/2007                            DOB:          06/21/1986    REFERRING PHYSICIAN:  Alanda Amass, M.D.   REASON FOR CONSULTATION:  Rectal bleeding.   HISTORY OF PRESENT ILLNESS:  This is a 25 year old African-American  female, who had a history of juvenile colon polyps removed at age 70  at [facility].  She relates many years of intermittent small-  volume hematochezia, associated with bowel movements.  It usually occurs  about once a month.  Recently, she had three days in a row with small  amounts of bright red blood per rectum, associated with bowel movements.  She notes no other gastrointestinal symptoms and specifically denies any  change in bowel habits, rectal pain, rectal itching, change in stool  caliber or weight-loss.   Patient has a grandmother and a great grandmother who have had colon  polyps.  No other family members with colon polyps, colon cancer or  inflammatory bowel disease.   PAST MEDICAL HISTORY:  Asthma.  Juvenile colon polyps, removed in 1998.   PAST SURGICAL HISTORY:  Negative.   CURRENT MEDICATIONS:  Listed on the chart, updated and reviewed.   MEDICATION ALLERGIES:  None known.   SOCIAL HISTORY:  Per the handwritten form.   REVIEW OF SYSTEMS:  Per the handwritten form.   PHYSICAL EXAM:  Overweight, no acute distress.  Height 5 feet 7, weight 169.4 pounds.  Blood pressure is 100/60, pulse  80 and regular.  HEENT EXAM:  Anicteric sclerae.  Oropharynx clear.  NECK:  Supple without adenopathy.  CHEST:  Clear to auscultation bilaterally.  CARDIAC:  Regular rate and rhythm without murmurs appreciated.  ABDOMEN:  Soft, nontender, nondistended.  Normoactive bowel sounds.  No  palpable organomegaly, masses or hernias.  RECTAL EXAMINATION:  Deferred to time of  colonoscopy.  A recent exam by  Dr. Alanda Amass revealed no lesions and Hemoccult-positive stool.  EXTREMITIES:  Without clubbing, cyanosis or edema.  NEUROLOGIC:  Alert and oriented times three.  Grossly nonfocal.   ASSESSMENT AND PLAN:  Small-volume hematochezia and Hemoccult-positive  stool.  I suspect she has a benign source of bleeding, such as internal  hemorrhoids.  Need to exclude colon polyps, proctitis and other  disorders.  Risks, benefits, and alternatives to colonoscopy with  possible biopsy, possible polypectomy and possible destruction of  internal hemorrhoids discussed with the patient, and she consents to  proceed.  This will be scheduled electively.     Venita Lick. Russella Dar, MD, [facility]  Electronically Signed    MTS/MedQ  DD: 12/25/2007  DT: 12/25/2007  Job #: [id number]

## 2011-04-04 ENCOUNTER — Telehealth: Payer: Self-pay | Admitting: Psychology

## 2011-04-04 ENCOUNTER — Encounter: Payer: Self-pay | Admitting: Family Medicine

## 2011-04-04 ENCOUNTER — Ambulatory Visit (INDEPENDENT_AMBULATORY_CARE_PROVIDER_SITE_OTHER): Payer: Self-pay | Admitting: Family Medicine

## 2011-04-04 VITALS — BP 119/83 | HR 85 | Ht 67.0 in | Wt 199.0 lb

## 2011-04-04 DIAGNOSIS — F3289 Other specified depressive episodes: Secondary | ICD-10-CM

## 2011-04-04 DIAGNOSIS — F39 Unspecified mood [affective] disorder: Secondary | ICD-10-CM

## 2011-04-04 DIAGNOSIS — F329 Major depressive disorder, single episode, unspecified: Secondary | ICD-10-CM

## 2011-04-04 MED ORDER — FLUOXETINE HCL 40 MG PO CAPS: 40.0000 mg | ORAL_CAPSULE | Freq: Every day | ORAL | Status: DC

## 2011-04-04 NOTE — Telephone Encounter (Signed)
Patient left VM to schedule an appt.  Called her back after reviewing Kristin Bruins note.  First available for a new patient in [facility] is not until August 15th.  Explored other options.  She has no insurance - has the orange card.  Has never been to community mental health.  Scheduled for the 15th at 10:00 and will discuss with Darl Pikes to see if other arrangements need to be made (although options are limited).  Patient wondered whether she would take the Prozac as Darl Pikes recommended.  She said initially that she got worse with the medicine (when she was taking it regularly) but added that thoughts about hurting herself were worse when she wasn't taking it.  Discussed safety and outlined options if thoughts of harm to self or others seemed unmanageable.  She has not plan or intent currently.  Did not assess further over the phone.  Stated that if Darl Pikes recommended that she take the medicine, that remained the recommendation until some other health care provider was able to assess further.  Voiced an understanding.

## 2011-04-04 NOTE — Assessment & Plan Note (Addendum)
Diagnosed by Dr. Constance Goltz.  Had been on fluoxetine 10 mg tid.   Prescribed fluoxetine 40 mg daily, with firm message to contact Dr. Pascal Lux for formal evaluation in mood clinic.  She uses SSRI almost prn, discussed regular daily use. Has never been seen by a psychologist or psychiatrist.

## 2011-04-04 NOTE — Patient Instructions (Signed)
Please call Dr. Pascal Lux for an apt with [facility] Prozac can be taken once daily

## 2011-04-04 NOTE — Progress Notes (Signed)
  Subjective:    Patient ID: Katie Dyer, female    DOB: 03-Oct-1986, 25 y.o.   MRN: [id number]  HPI Very poor historian.  Initially said that she needed a refill on her Prozac, she has been out for weeks.  Dr. Gomez Cleverly had prescribed her 10 mg tid.  She reports taking and stopping based on how she felt at any given time.  She endorsed periods of mania that she referred to as "playing".  Yesterday she had a hard time getting out of bed.  She works in a day care.  This was the first time I met her, had her take the Mood Disorder Questionnaire.  Under number 1-she had 7 yes, and 5 no; yes to the question that some of the above happened at the same time.  She considers this a minor problem.  She endorsed both a + FH and a professional opinion of bipolar illness.  When I brought up the diagnosis, she just grunted.   Review of Systems  Psychiatric/Behavioral: Positive for dysphoric mood. Negative for suicidal ideas. The patient is not nervous/anxious.        Objective:   Physical Exam  Constitutional: She appears well-developed and well-nourished.       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   Psychiatric:       Flat affect, grunted to yes and no, did have pressured speech when she did speak.          Assessment & Plan:

## 2011-04-05 ENCOUNTER — Other Ambulatory Visit: Payer: Self-pay | Admitting: Family Medicine

## 2011-04-05 NOTE — Telephone Encounter (Signed)
Gave her the number for the [facility] and encouraged her to call for an apt sooner.  She denies being suicidal at this time.

## 2011-04-11 NOTE — Telephone Encounter (Signed)
Phoned patient per Kristin Bruins request to see how she is doing.  Patient reported she is doing well.  She has an appt at [facility] for July 17th.  Asked her to call to let us know what was decided.  Will keep her [facility] appt for now but may follow to see if this is still needed.

## 2011-05-19 NOTE — Telephone Encounter (Signed)
Called patient to see if she still needed [facility] appointment on 9/15.  She reported she had been to the [facility] (its replacement) and has a follow-up today.  She said she did not need her appointment here.

## 2011-08-09 ENCOUNTER — Inpatient Hospital Stay (HOSPITAL_COMMUNITY)
Admission: RE | Admit: 2011-08-09 | Discharge: 2011-08-09 | Disposition: A | Discharge status: Home / Self Care | Payer: Self-pay | Source: Ambulatory Visit | Attending: Family Medicine | Admitting: Family Medicine

## 2011-08-10 ENCOUNTER — Encounter: Payer: Self-pay | Admitting: Family Medicine

## 2011-08-10 ENCOUNTER — Ambulatory Visit (INDEPENDENT_AMBULATORY_CARE_PROVIDER_SITE_OTHER): Payer: Self-pay | Admitting: Family Medicine

## 2011-08-10 DIAGNOSIS — N946 Dysmenorrhea, unspecified: Secondary | ICD-10-CM

## 2011-08-10 DIAGNOSIS — Z23 Encounter for immunization: Secondary | ICD-10-CM

## 2011-08-10 DIAGNOSIS — J069 Acute upper respiratory infection, unspecified: Secondary | ICD-10-CM | POA: Insufficient documentation

## 2011-08-10 MED ORDER — CETIRIZINE HCL 10 MG PO CHEW: 10.0000 mg | CHEWABLE_TABLET | Freq: Every day | ORAL | Status: DC

## 2011-08-10 MED ORDER — AZITHROMYCIN 500 MG PO TABS: 500.0000 mg | ORAL_TABLET | Freq: Every day | ORAL | Status: AC

## 2011-08-10 MED ORDER — ALBUTEROL SULFATE HFA 108 (90 BASE) MCG/ACT IN AERS: 2.0000 | INHALATION_SPRAY | RESPIRATORY_TRACT | Status: DC | PRN

## 2011-08-10 MED ORDER — BECLOMETHASONE DIPROPIONATE 40 MCG/ACT IN AERS: 2.0000 | INHALATION_SPRAY | Freq: Two times a day (BID) | RESPIRATORY_TRACT | Status: DC

## 2011-08-10 MED ORDER — NORGESTIMATE-ETH ESTRADIOL 0.25-35 MG-MCG PO TABS: 1.0000 | ORAL_TABLET | Freq: Every day | ORAL | Status: DC

## 2011-08-10 NOTE — Assessment & Plan Note (Signed)
We will start Sprintec daily patient will use 3 months worth at a time patient counseled on potential side effects and when to seek medical attention as well as proper use. Patient will return to clinic for a Pap smear in the near future and we can discuss this further

## 2011-08-10 NOTE — Assessment & Plan Note (Signed)
Likely more related to patient's seasonal allergic rhinitis we will treat with azithromycin as well as making patient is to restart her regular allergic rhinitis medications patient does have red flags and when to seek medical attention

## 2011-08-10 NOTE — Progress Notes (Signed)
  Subjective:    Patient ID: Katie Dyer, female    DOB: November 04, 1985, 25 y.o.   MRN: [id number]  HPI 25 year old female who coming in with a four-day history of cough and congestion. Patient states that it started 4 days ago nonproductive cough no fevers but has had some chills recently patient denies any chest pain shortness of breath any dizziness lightheadedness or abdominal pain. Patient denies any recent sick contacts. Patient has a past medical history significant for allergic rhinitis and this does seem to be that time of year for her.    Patient also would like to discuss possible per control options. Patient had previously been on birth control for painful menstruation did not take the medication regularly and discontinued on her own. Patient is in a monogamous relationship does not have any history of sexual transmitted diseases but they do not use condoms on a regular basis and patient does not want to expand her family at this time. Patient was would not mind using it for her painful menstruation patient is regular with her menstrual cycle.    Review of Systems As stated in the history of present illness    Objective:   Physical Exam General: Patient does appear cold and is shaking somewhat HEENT: Tympanic membranes have fluid behind both of them nonbulging non-erythemic patient does have a significant postnasal drip with some mild posterior pharynx erythema turbinates are erythemic Cardiovascular: Regular rate and rhythm no murmur Pulmonary: Clear to auscultation bilaterally Abdomen bowel sounds positive nontender nondistended no masses Pelvic: Deferred Extremities 2+ pulses    Assessment & Plan:

## 2011-08-10 NOTE — Patient Instructions (Signed)
Good to meet you. I am refilling all your medications I am giving you an antibiotic to take for the next 3 days I am giving you a nuclear control pills please review instructions. He would do for your Pap smear soon please make an appointment for that.

## 2011-08-18 ENCOUNTER — Emergency Department (INDEPENDENT_AMBULATORY_CARE_PROVIDER_SITE_OTHER)
Admission: EM | Admit: 2011-08-18 | Discharge: 2011-08-18 | Disposition: A | Discharge status: Home / Self Care | Payer: Self-pay | Source: Home / Self Care

## 2011-08-18 ENCOUNTER — Encounter (HOSPITAL_COMMUNITY): Payer: Self-pay | Admitting: Emergency Medicine

## 2011-08-18 DIAGNOSIS — K5900 Constipation, unspecified: Secondary | ICD-10-CM

## 2011-08-18 LAB — POCT URINALYSIS DIP (DEVICE)
Bilirubin Urine: NEGATIVE
Hgb urine dipstick: NEGATIVE
Ketones, ur: NEGATIVE mg/dL
Protein, ur: NEGATIVE mg/dL
Specific Gravity, Urine: >=1.03 (ref 1.005–1.030)
pH: 7 (ref 5.0–8.0)

## 2011-08-18 MED ORDER — POLYETHYLENE GLYCOL 3350 17 GM/SCOOP PO POWD: 17.0000 g | Freq: Every day | ORAL | Status: AC

## 2011-08-18 MED ORDER — GLYCERIN (LAXATIVE) 3 G RE SUPP: 1.0000 | RECTAL | Status: DC | PRN

## 2011-08-18 NOTE — ED Notes (Signed)
Pt comes in with left lowr quad sharp constant pains radiating down to pubis area x 4 dys.pt denies vag d/c or urine problems.lmp 3 wks ago.denies vomit,diarhea,fever.

## 2011-08-18 NOTE — ED Provider Notes (Signed)
History     CSN: [id number] Arrival date & time: 08/18/2011  5:45 PM   First MD Initiated Contact with Patient 08/18/11 1814      Chief Complaint  Patient presents with  . Abdominal Pain    Patient is a 25 y.o. female presenting with abdominal pain. The history is provided by the patient. No language interpreter was used.  Abdominal Pain The primary symptoms of the illness include abdominal pain and nausea. The primary symptoms of the illness do not include shortness of breath, vomiting, diarrhea, vaginal discharge or vaginal bleeding. The problem has not changed since onset. The patient states that she believes she is currently not pregnant. Additional symptoms associated with the illness include constipation. Symptoms associated with the illness do not include chills, anorexia, diaphoresis, heartburn, urgency, hematuria, frequency or back pain.   Pt with sharp constant nonradiating LLQ pain x 4 days. Not affected by eating, stooling, urination. pain slightly worse with ambulation, palpation LLQ.  Tried motrin 800 mg yesterday with some relief States feels similar to prev episodes of constipation. Has not had effective BM in "a week." but has passed small amt of hard stool today. Mild nausea. no vomiting, fevers, anorexia. No melena, hematochezia, pain with defectation, abd distension.  .  Past Medical History  Diagnosis Date  . Asthma     Past Surgical History  Procedure Date  . Tonsillectomy   . Cervical polypectomy     Family History  Problem Relation Age of Onset  . Hypertension Other     History  Substance Use Topics  . Smoking status: Never Smoker   . Smokeless tobacco: Not on file  . Alcohol Use: Yes     ocassionally    OB History    Grav Para Term Preterm Abortions TAB SAB Ect Mult Living                  Review of Systems  Constitutional: Negative for chills and diaphoresis.  Respiratory: Negative for cough and shortness of breath.   Cardiovascular:  Negative for chest pain.  Gastrointestinal: Positive for nausea, abdominal pain and constipation. Negative for heartburn, vomiting, diarrhea, blood in stool, abdominal distention, anal bleeding, rectal pain and anorexia.  Genitourinary: Negative for urgency, frequency, hematuria, vaginal bleeding, vaginal discharge, difficulty urinating and pelvic pain.  Musculoskeletal: Negative for back pain.  Neurological: Negative for weakness.    Allergies  Review of patient's allergies indicates no known allergies.  Home Medications   Current Outpatient Rx  Name Route Sig Dispense Refill  . ALBUTEROL SULFATE HFA 108 (90 BASE) MCG/ACT IN AERS Inhalation Inhale 2 puffs into the lungs every 4 (four) hours as needed for wheezing. 1 Inhaler 2  . NORGESTIMATE-ETH ESTRADIOL 0.25-35 MG-MCG PO TABS Oral Take 1 tablet by mouth daily. 3 Package 3  . BECLOMETHASONE DIPROPIONATE 40 MCG/ACT IN AERS Inhalation Inhale 2 puffs into the lungs 2 (two) times daily. 1 Inhaler 3  . CETIRIZINE HCL 10 MG PO CHEW Oral Chew 1 tablet (10 mg total) by mouth daily. 90 tablet 3  . GLYCERIN (LAXATIVE) 3 G RE SUPP Rectal Place 1 suppository (3 g total) rectally as needed. 3 suppository 0  . POLYETHYLENE GLYCOL 3350 PO POWD Oral Take 17 g by mouth daily. 255 g 0    Pulse 82  Temp(Src) 98.3 F (36.8 C) (Oral)  Resp 20  SpO2 98%  LMP 07/20/2011  Physical Exam  Nursing note and vitals reviewed. Constitutional: She is oriented to person, place,  and time. She appears well-developed and well-nourished. No distress.  HENT:  Head: Normocephalic and atraumatic.  Eyes: EOM are normal. Pupils are equal, round, and reactive to light.  Neck: Normal range of motion.  Cardiovascular: Regular rhythm.   Pulmonary/Chest: Effort normal and breath sounds normal.  Abdominal: Soft. Bowel sounds are normal. She exhibits no distension and no mass. There is no hepatosplenomegaly. There is tenderness in the left lower quadrant. There is no  rigidity, no rebound, no guarding, no CVA tenderness, no tenderness at McBurney's point and negative Murphy's sign. No hernia.       Normal rectal tone, no tenderness, no impaction, minimal amt stool in vault. Hemoccult negative  Musculoskeletal: Normal range of motion.  Neurological: She is alert and oriented to person, place, and time.  Skin: Skin is warm and dry.  Psychiatric: She has a normal mood and affect. Her behavior is normal. Judgment and thought content normal.    ED Course  Procedures (including critical care time)   Results for orders placed during the hospital encounter of 08/18/11  POCT URINALYSIS DIP (DEVICE)      Component Value Range   Glucose, UA NEGATIVE  NEGATIVE (mg/dL)   Bilirubin Urine NEGATIVE  NEGATIVE    Ketones, ur NEGATIVE  NEGATIVE (mg/dL)   Specific Gravity, Urine >=1.030  1.005 - 1.030    Hgb urine dipstick NEGATIVE  NEGATIVE    pH 7.0  5.0 - 8.0    Protein, ur NEGATIVE  NEGATIVE (mg/dL)   Urobilinogen, UA 1.0  0.0 - 1.0 (mg/dL)   Nitrite NEGATIVE  NEGATIVE    Leukocytes, UA NEGATIVE  NEGATIVE   POCT PREGNANCY, URINE      Component Value Range   Preg Test, Ur NEGATIVE      1. Constipation       MDM  abd exam benign, h&P most c/w constipation will try increasing fluids, miralax. Return if no improvement in 3 days.       Danella Maiers [facility] 08/18/11 1958

## 2011-09-09 ENCOUNTER — Inpatient Hospital Stay (HOSPITAL_COMMUNITY)
Admission: AD | Admit: 2011-09-09 | Discharge: 2011-09-09 | Disposition: A | Discharge status: Home / Self Care | Payer: Self-pay | Source: Ambulatory Visit | Attending: Obstetrics and Gynecology | Admitting: Obstetrics and Gynecology

## 2011-09-09 ENCOUNTER — Encounter (HOSPITAL_COMMUNITY): Payer: Self-pay

## 2011-09-09 DIAGNOSIS — N938 Other specified abnormal uterine and vaginal bleeding: Secondary | ICD-10-CM | POA: Insufficient documentation

## 2011-09-09 DIAGNOSIS — N949 Unspecified condition associated with female genital organs and menstrual cycle: Secondary | ICD-10-CM | POA: Insufficient documentation

## 2011-09-09 DIAGNOSIS — R109 Unspecified abdominal pain: Secondary | ICD-10-CM | POA: Insufficient documentation

## 2011-09-09 HISTORY — DX: Major depressive disorder, single episode, unspecified: F32.9

## 2011-09-09 HISTORY — DX: Polyp of colon: K63.5

## 2011-09-09 LAB — URINALYSIS, ROUTINE W REFLEX MICROSCOPIC
Bilirubin Urine: NEGATIVE
Glucose, UA: NEGATIVE mg/dL
Ketones, ur: NEGATIVE mg/dL
Leukocytes, UA: NEGATIVE
pH: 7 (ref 5.0–8.0)

## 2011-09-09 LAB — CBC
HCT: 38.4 % (ref 36.0–46.0)
Hemoglobin: 13 g/dL (ref 12.0–15.0)
MCH: 30.6 pg (ref 26.0–34.0)
MCV: 90.4 fL (ref 78.0–100.0)
RBC: 4.25 MIL/uL (ref 3.87–5.11)

## 2011-09-09 LAB — URINE MICROSCOPIC-ADD ON

## 2011-09-09 LAB — WET PREP, GENITAL: Trich, Wet Prep: NONE SEEN

## 2011-09-09 MED ORDER — DICLOFENAC SODIUM 75 MG PO TBEC: 75.0000 mg | DELAYED_RELEASE_TABLET | Freq: Two times a day (BID) | ORAL | Status: DC

## 2011-09-09 NOTE — ED Provider Notes (Signed)
Chief Complaint:  Vaginal Bleeding and Abdominal Pain   Katie Dyer is  25 y.o. G0P0.  Patient's last menstrual period was 08/27/2011.Marland Kitchen  Her pregnancy status is negative.  She presents complaining of Vaginal Bleeding and Abdominal Pain  States period started on 08/28/11 and has not stopped. Reports starting Sprintec OCPs 2 months ago. Taking continuous. Last month period came on time and lasted for 3-4 days. This month, came on time and has not stopped. Changing pad q 1-2 hours with quarter-sized clots. Rates abd cramping intermittent 10/10, unrelieved by 800mg  Ibuprofen, calls out of work when cramping due to pain.   Denies CP, Dizziness, SOB, N/V/D or recent change in medications, No dysuria, vaginal discharge, or dyspareunia.  Increased stress r/t getting married tomorrow.   Obstetrical/Gynecological History: OB History    Grav Para Term Preterm Abortions TAB SAB Ect Mult Living   0               Past Medical History: Past Medical History  Diagnosis Date  . Asthma   . Intestinal polyps   . Depression     Past Surgical History: Past Surgical History  Procedure Date  . Tonsillectomy   . Cervical polypectomy   . Colonoscopy     Family History: Family History  Problem Relation Age of Onset  . Hypertension Other   . Anesthesia problems Neg Hx     Social History: History  Substance Use Topics  . Smoking status: Never Smoker   . Smokeless tobacco: Not on file  . Alcohol Use: Yes     ocassionally    Allergies: No Known Allergies  Prescriptions prior to admission  Medication Sig Dispense Refill  . albuterol (PROVENTIL HFA;VENTOLIN HFA) 108 (90 BASE) MCG/ACT inhaler Inhale 2 puffs into the lungs every 4 (four) hours as needed. Shortness of breath       . diphenhydrAMINE (SOMINEX) 25 MG tablet Take 50 mg by mouth at bedtime as needed. alleriges       . Glycerin, Laxative, 3 G SUPP Place 1 suppository rectally as needed. constipation       . norgestimate-ethinyl  estradiol (ORTHO-CYCLEN,SPRINTEC,PREVIFEM) 0.25-35 MG-MCG tablet Take 1 tablet by mouth daily.        Marland Kitchen DISCONTD: albuterol (VENTOLIN HFA) 108 (90 BASE) MCG/ACT inhaler Inhale 2 puffs into the lungs every 4 (four) hours as needed for wheezing.  1 Inhaler  2  . DISCONTD: Glycerin, Laxative, (GLYCERIN ADULT) 3 G SUPP Place 1 suppository (3 g total) rectally as needed.  3 suppository  0  . DISCONTD: norgestimate-ethinyl estradiol (ORTHO-CYCLEN,SPRINTEC,PREVIFEM) 0.25-35 MG-MCG tablet Take 1 tablet by mouth daily.  3 Package  3    Review of Systems - Negative except what has been reviewed in the HPI  Physical Exam   Blood pressure 130/86, pulse 91, temperature 98.6 F (37 C), temperature source Oral, resp. rate 20, height 5\' 8"  (1.727 m), weight 90.992 kg (200 lb 9.6 oz), last menstrual period 08/27/2011, SpO2 99.00%.  General: General appearance - alert, well appearing, and in no distress, oriented to person, place, and time and overweight Mental status - alert, oriented to person, place, and time, normal mood, behavior, speech, dress, motor activity, and thought processes, affect appropriate to mood Abdomen - soft, nontender, nondistended, no masses or organomegaly Focused Gynecological Exam: VULVA: normal appearing vulva with no masses, tenderness or lesions, VAGINA: moderate blding noted in vault, no clots, CERVIX: unable to visualize d/t pt discomfort, no CMT, UTERUS: uterus is normal size,  shape, consistency and nontender, ADNEXA: normal adnexa in size, nontender and no masses  Labs: Recent Results (from the past 24 hour(s))  CBC   Collection Time   09/09/11  8:26 AM      Component Value Range   WBC 7.6  4.0 - 10.5 (K/uL)   RBC 4.25  3.87 - 5.11 (MIL/uL)   Hemoglobin 13.0  12.0 - 15.0 (g/dL)   HCT 16.1  09.6 - 04.5 (%)   MCV 90.4  78.0 - 100.0 (fL)   MCH 30.6  26.0 - 34.0 (pg)   MCHC 33.9  30.0 - 36.0 (g/dL)   RDW 40.9  81.1 - 91.4 (%)   Platelets 376  150 - 400 (K/uL)  URINALYSIS,  ROUTINE W REFLEX MICROSCOPIC   Collection Time   09/09/11  8:28 AM      Component Value Range   Color, Urine YELLOW  YELLOW    APPearance CLEAR  CLEAR    Specific Gravity, Urine 1.020  1.005 - 1.030    pH 7.0  5.0 - 8.0    Glucose, UA NEGATIVE  NEGATIVE (mg/dL)   Hgb urine dipstick LARGE (*) NEGATIVE    Bilirubin Urine NEGATIVE  NEGATIVE    Ketones, ur NEGATIVE  NEGATIVE (mg/dL)   Protein, ur NEGATIVE  NEGATIVE (mg/dL)   Urobilinogen, UA 0.2  0.0 - 1.0 (mg/dL)   Nitrite NEGATIVE  NEGATIVE    Leukocytes, UA NEGATIVE  NEGATIVE   URINE MICROSCOPIC-ADD ON   Collection Time   09/09/11  8:28 AM      Component Value Range   Squamous Epithelial / LPF RARE  RARE    WBC, UA 0-2  <3 (WBC/hpf)   RBC / HPF 11-20  <3 (RBC/hpf)   Bacteria, UA RARE  RARE   POCT PREGNANCY, URINE   Collection Time   09/09/11  8:32 AM      Component Value Range   Preg Test, Ur NEGATIVE     Imaging Studies:  No results found.   Assessment: Patient Active Problem List  Diagnoses  . OVERWEIGHT  . ANEMIA-NOS  . MOOD DISORDER  . DEPRESSION  . ALLERGIC RHINITIS, SEASONAL  . ASTHMA, PERSISTENT  . RECTAL POLYPS  . MENSTRUATION, PAINFUL  . EXCESSIVE MENSTRUAL BLEEDING  . DERMATITIS, SEBORRHEIC  . URI (upper respiratory infection)    Plan: OCP taper with Sprintec Diclofenac Rx FU with Dr. Katrinka Blazing or [facility]. 09/09/2011,8:56 AM

## 2011-09-09 NOTE — Progress Notes (Signed)
Patient states she started taking Sprintec BCP's about 2 months ago, has one normal cycle the started bleeding on 11-19 and has not stopped. Has bleeding every day with abdominal cramping.

## 2011-09-11 LAB — GC/CHLAMYDIA PROBE AMP, GENITAL: Chlamydia, DNA Probe: NEGATIVE

## 2011-09-18 ENCOUNTER — Ambulatory Visit (INDEPENDENT_AMBULATORY_CARE_PROVIDER_SITE_OTHER): Payer: Self-pay | Admitting: Family Medicine

## 2011-09-18 ENCOUNTER — Encounter: Payer: Self-pay | Admitting: Family Medicine

## 2011-09-18 VITALS — BP 126/81 | HR 101 | Temp 97.7°F | Ht 68.0 in | Wt 198.7 lb

## 2011-09-18 DIAGNOSIS — N920 Excessive and frequent menstruation with regular cycle: Secondary | ICD-10-CM

## 2011-09-18 LAB — PROTIME-INR: Prothrombin Time: 13.5 seconds (ref 11.6–15.2)

## 2011-09-18 LAB — APTT: aPTT: 33 seconds (ref 24–37)

## 2011-09-18 MED ORDER — MEDROXYPROGESTERONE ACETATE 10 MG PO TABS: 10.0000 mg | ORAL_TABLET | Freq: Two times a day (BID) | ORAL | Status: DC

## 2011-09-18 NOTE — ED Provider Notes (Signed)
Attestation of Attending Supervision of Advanced Practitioner: Evaluation and management procedures were performed by the PA/NP/CNM/OB Fellow under my supervision/collaboration. Chart reviewed and agree with management and plan.  Simeon Vera V 09/18/2011 8:12 AM

## 2011-09-18 NOTE — Patient Instructions (Signed)
It was nice to meet you today!  Please take the Provera 10mg  twice daily for the next 7 days. You can then restart birth control.  Please come back in 2 weeks, especially if you are still bleeding. We will then arrange for you to have an ultrasound.   Congratulations on the marriage and have a very [place] Christmas!  Amber M. Hairford, M.D.

## 2011-09-19 ENCOUNTER — Encounter: Payer: Self-pay | Admitting: Family Medicine

## 2011-09-19 DIAGNOSIS — N920 Excessive and frequent menstruation with regular cycle: Secondary | ICD-10-CM | POA: Insufficient documentation

## 2011-09-19 HISTORY — DX: Excessive and frequent menstruation with regular cycle: N92.0

## 2011-09-19 NOTE — Progress Notes (Signed)
  Subjective:    Patient ID: Katie Dyer, female    DOB: January 04, 1986, 25 y.o.   MRN: [id number]  HPI  Patient is a 25 yo female coming to clinic today as a SDA for 3 week history of vaginal bleeding. Patient states she was started on Sprintec 2 months ago, but starting in mid-November she had heavy period that did not go away. She went to [facility] on December 1 for evaluation of the bleeding. They did a pelvic exam as well as lab work, and instructed her to double up on her birth control for 4 days. Patient did that, but her bleeding did not stop. (It did slow down to off/on but did not go away.) It is associated with cramping, but she denies any N/V or rectal bleeding.  Patient states she normally gets her cycle every month and it last 2-3 days. Last year she had an episode of bleeding for 2 weeks which did resolve with increased dosing of OCP. She denies any sexual activity. (Patient states she got married on Dec 2, but states she still has not had sex.) Her aunts, grandmother and cousins had early hysterectomies due to menorrhagia.   Review of Systems  Constitutional: Negative for fever, chills, activity change and fatigue.  HENT: Negative for congestion.   Respiratory: Negative for chest tightness and shortness of breath.   Cardiovascular: Negative for chest pain, palpitations and leg swelling.  Gastrointestinal: Positive for abdominal pain. Negative for blood in stool.  Genitourinary: Positive for vaginal bleeding. Negative for dysuria.  Musculoskeletal: Negative for myalgias and arthralgias.  Skin: Negative for rash.  Neurological: Negative for dizziness and headaches.  All other systems reviewed and are negative.       Objective:   Physical Exam  Vitals reviewed. Constitutional: She is oriented to person, place, and time. She appears well-developed and well-nourished. No distress.  HENT:  Head: Normocephalic and atraumatic.  Eyes: Conjunctivae are normal. Pupils are equal, round,  and reactive to light.  Cardiovascular: Normal rate and regular rhythm.   Pulmonary/Chest: Effort normal and breath sounds normal.  Abdominal: Soft. She exhibits no distension. There is no tenderness.  Musculoskeletal: Normal range of motion.  Lymphadenopathy:    She has no cervical adenopathy.  Neurological: She is alert and oriented to person, place, and time.  Skin: Skin is warm and dry. She is not diaphoretic.          Assessment & Plan:

## 2011-09-19 NOTE — Assessment & Plan Note (Signed)
Evaluated with [facility] one week ago. At that time, pelvic exam, Upreg, wet prep and CBC were unremarkable. Bleeding did not stop with increased dosing of OCP. Will try Provera 10mg  po BID x 7 days to stop the bleeding. She will return to the office in 2 weeks for a follow-up appointment. If she continues to have bleeding, consider pelvic ultrasound to evaluate for fibroids.  Also, patient was asking about various types of birth control. We had a brief conversation, but patient states she will talk to Dr. Katrinka Blazing if she is interested in switching from [facility] to another form.

## 2011-09-23 ENCOUNTER — Inpatient Hospital Stay (HOSPITAL_COMMUNITY): Payer: Self-pay

## 2011-09-23 ENCOUNTER — Inpatient Hospital Stay (HOSPITAL_COMMUNITY)
Admission: AD | Admit: 2011-09-23 | Discharge: 2011-09-23 | Disposition: A | Discharge status: Home / Self Care | Payer: Self-pay | Source: Ambulatory Visit | Attending: Obstetrics and Gynecology | Admitting: Obstetrics and Gynecology

## 2011-09-23 ENCOUNTER — Encounter (HOSPITAL_COMMUNITY): Payer: Self-pay | Admitting: Obstetrics and Gynecology

## 2011-09-23 ENCOUNTER — Telehealth: Payer: Self-pay | Admitting: Family Medicine

## 2011-09-23 DIAGNOSIS — N949 Unspecified condition associated with female genital organs and menstrual cycle: Secondary | ICD-10-CM | POA: Insufficient documentation

## 2011-09-23 DIAGNOSIS — N938 Other specified abnormal uterine and vaginal bleeding: Secondary | ICD-10-CM | POA: Insufficient documentation

## 2011-09-23 DIAGNOSIS — N920 Excessive and frequent menstruation with regular cycle: Secondary | ICD-10-CM

## 2011-09-23 LAB — CBC
HCT: 39.7 % (ref 36.0–46.0)
Platelets: 376 10*3/uL (ref 150–400)
RBC: 4.44 MIL/uL (ref 3.87–5.11)
RDW: 12.8 % (ref 11.5–15.5)
WBC: 6 10*3/uL (ref 4.0–10.5)

## 2011-09-23 NOTE — ED Provider Notes (Signed)
History     Chief Complaint  Patient presents with  . Vaginal Bleeding   HPI 25 y.o. G0P0000 with vaginal bleeding x 1 month. Has been seen x 2 for this problem at [facility]. Originally tried a taper of her Sprintec, that did not stop the bleeding, then was given a 5 day course of Provera 10 mg BID, bleeding did not slow down, has finished Provera and now bleeding is heavier. Had pelvic exam last Monday at [facility]. States she has family history of fibroids, but has never been told that she has fibroids.     Past Medical History  Diagnosis Date  . Asthma   . Intestinal polyps   . Depression     Past Surgical History  Procedure Date  . Tonsillectomy   . Cervical polypectomy   . Colonoscopy     Family History  Problem Relation Age of Onset  . Hypertension Other   . Anesthesia problems Neg Hx     History  Substance Use Topics  . Smoking status: Never Smoker   . Smokeless tobacco: Not on file  . Alcohol Use: Yes     ocassionally    Allergies: No Known Allergies  Prescriptions prior to admission  Medication Sig Dispense Refill  . acetaminophen (TYLENOL) 500 MG tablet Take 500 mg by mouth every 6 (six) hours as needed. Takes for pain       . albuterol (PROVENTIL HFA;VENTOLIN HFA) 108 (90 BASE) MCG/ACT inhaler Inhale 2 puffs into the lungs every 4 (four) hours as needed. Shortness of breath       . ibuprofen (ADVIL,MOTRIN) 200 MG tablet Take 200 mg by mouth every 6 (six) hours as needed. Takes for pain        . norgestimate-ethinyl estradiol (ORTHO-CYCLEN,SPRINTEC,PREVIFEM) 0.25-35 MG-MCG tablet Take 1 tablet by mouth daily.          Review of Systems  Constitutional: Negative.   Respiratory: Negative.   Cardiovascular: Negative.   Gastrointestinal: Negative for nausea, vomiting, abdominal pain, diarrhea and constipation.  Genitourinary: Negative for dysuria, urgency, frequency, hematuria and flank pain.       Positive for vaginal bleeding  Musculoskeletal: Negative.     Neurological: Negative.   Psychiatric/Behavioral: Negative.    Physical Exam   Blood pressure 126/86, pulse 87, temperature 98.6 F (37 C), temperature source Oral, resp. rate 18, height 5\' 7"  (1.702 m), weight 198 lb 3.2 oz (89.903 kg), last menstrual period 08/27/2011.  Physical Exam  Nursing note and vitals reviewed. Constitutional: She is oriented to person, place, and time. She appears well-developed and well-nourished. No distress.  Cardiovascular: Normal rate.   Respiratory: Effort normal.  GI: Soft. There is no tenderness.  Musculoskeletal: Normal range of motion.  Neurological: She is alert and oriented to person, place, and time.  Skin: Skin is warm and dry.  Psychiatric: She has a normal mood and affect.    MAU Course  Procedures  US Pelvis Complete  09/23/2011  *RADIOLOGY REPORT*  Clinical Data: Abnormal vaginal bleeding. Bleeding for 1 month. The patient has taken birth control pills to stop bleeding, without success.  TRANSABDOMINAL ULTRASOUND OF PELVIS  Technique:  Transabdominal ultrasound examination of the pelvis was performed including evaluation of the uterus, ovaries, adnexal regions, and pelvic cul-de-sac.  Comparison:  None  Findings:  Uterus:  The uterus is 6.2 x 3.2 x 0.2 cm.  No uterine mass identified.  Endometrium: The endometrium is normal in appearance, 4.5 mm in thickness.  Right ovary: The  right ovary is 3.1 x 1.9 x 3.1 cm and has a normal appearance.  Left ovary: The left ovary is not visualized, likely because of intervening bowel loops.  Other Findings:  No free fluid  IMPRESSION:  1.  Normal-appearing uterus and right ovary. 2.  Non-visualized left ovary.  Original Report Authenticated By: Patterson Hammersmith, M.D.   Hgb 13.8  Assessment and Plan  25 y.o. G0P0000 with DUB F/U in office for ongoing management  Katie Dyer,Katie Dyer 09/23/2011, 2:02 PM

## 2011-09-23 NOTE — Progress Notes (Signed)
Pt presents to MAU with chief complaint of vaginal bleeding X1 month. Pt had this same problem last year; doubled up birth control and symptoms stopped. Pt is seen at Dr. Lonn Georgia office; [facility] and was seen this past Monday; prescribed provera x5 days. No hx of fibroids but says it runs in her family. Pt says the bleeding is worse today than it has been. Complains of Headache and abdominal cramping.

## 2011-09-23 NOTE — Progress Notes (Signed)
Pt reprots she has been on her menstral cycle x 1 month. On sprintec (BCP) told to double up. Still bleeding saw her doctor strted on Provera on Monday. Bleeding is heavier.

## 2011-09-23 NOTE — Telephone Encounter (Signed)
Pt calling emergency line and states that she has had increased vaginal bleeding.  Has had this problem over the past month, was seen this week in the office and started on provera, but seems like the bleeding is getting worse not improving.  No dizziness. Pt does feel weak and is concerned about the increase volume of vaginal bleeding.  Instructed pt to go to ER.

## 2011-09-26 ENCOUNTER — Ambulatory Visit (INDEPENDENT_AMBULATORY_CARE_PROVIDER_SITE_OTHER): Payer: Self-pay | Admitting: Family Medicine

## 2011-09-26 ENCOUNTER — Telehealth: Payer: Self-pay | Admitting: Family Medicine

## 2011-09-26 ENCOUNTER — Encounter: Payer: Self-pay | Admitting: Family Medicine

## 2011-09-26 DIAGNOSIS — N920 Excessive and frequent menstruation with regular cycle: Secondary | ICD-10-CM

## 2011-09-26 NOTE — Patient Instructions (Addendum)
I am glad your bleeding is slowing down.   Continue to take the Sprintec.  Follow-up as needed if your bleeding worsens and you become lightheaded, I would recommend you go the MAU.  Otherwise, follow-up with your PCP for your regular routine physical within the next year.

## 2011-09-26 NOTE — Progress Notes (Signed)
  Subjective:    Patient ID: Katie Dyer, female    DOB: 1986/07/03, 25 y.o.   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  Since her last visit with [place] on 12/10, she was seen at [facility] on 12/15 where trans-vaginal ultrasound was performed, which was normal and did not show fibroids and did show a normal endometrium (4.5 mm). She finished high-dose Provera 2 days ago, now back on Sprintec. Her periods started lightening up today. She had been having to change pads (she uses 2 at a time) every other hour for about the past 4 weeks, since the middle of November. However, today she has only had to change her pads twice.  She has no history of dysfunctional uterine bleeding, but she has had several family members with similar symptoms and who have had to get hysterectomies for this reason.  Review of Systems Denies nausea/vomiting.  Denies lightheadedness, dizziness.     Objective:   Physical Exam Gen: NAD Psych: pleasant CV: RRR Skin: warm, dry    Assessment & Plan:

## 2011-09-26 NOTE — Assessment & Plan Note (Signed)
Here for follow-up of menorrhagia. Has been having period since mid-November. Started lightening up today. Has been evaluated by [facility] twice and here twice (including today's visit). Trans-vaginal ultrasound sound normal endometrial thickness and no fibroids. No endometrial biopsy performed since no clear indication (she is young, not obese, has had regular periods in the past with this current episode being her only abnormal period).  Patient finished course of Provera and is now back on Sprintec. She remained hemodynamically stable throughout this episode. Advised her to follow-up as needed or for her regular physical within year.

## 2011-09-26 NOTE — Telephone Encounter (Signed)
Pt seen today by Dr. Madolyn Frieze, wants to know if she can be switched from sprintec to depo to help with bleeding? Scheduled appt with Dr. Katrinka Blazing on 1/11 to discuss referral to GYN.

## 2011-09-26 NOTE — ED Provider Notes (Signed)
Agree with above note.  Avyukth Bontempo 09/26/2011 1:17 PM   

## 2011-09-26 NOTE — Telephone Encounter (Signed)
I would advise her to continue the Sprintec for now since her bleeding has just started to let-up and discuss it with her PCP at 01/11.  Thank you for your help Denny Peon.

## 2011-09-27 NOTE — Telephone Encounter (Signed)
Forwarding to the team, pt may have questions that I cannot answer.

## 2011-09-27 NOTE — Telephone Encounter (Signed)
Pt informed and agreeable. Katie Dyer  

## 2011-09-27 NOTE — Telephone Encounter (Signed)
LMOVM for pt to call me back .Torian Thoennes, Maryjo Rochester

## 2011-10-20 ENCOUNTER — Ambulatory Visit: Payer: Self-pay | Admitting: Family Medicine

## 2011-11-20 ENCOUNTER — Telehealth: Payer: Self-pay | Admitting: Family Medicine

## 2011-11-20 NOTE — Telephone Encounter (Signed)
Patient notified

## 2011-11-20 NOTE — Telephone Encounter (Signed)
Pt has questions about her BCP - she's been on them since last October.

## 2011-11-20 NOTE — Telephone Encounter (Signed)
Please call pt back and tell her it may take up to 3 months to fix.  If she continues to bleed the same for the next 1-2 weeks she should come in and see me, we may need to increase her dose.

## 2011-11-20 NOTE — Telephone Encounter (Addendum)
Patient states she actually started BCP Nov 23. She had bleeding the whole month of December. Stopped by Jan 1. Now for past 5 days has had brownish discharge. States Dr. Katrinka Blazing  told her if she didn't take the blue pills and started back on white pills she would not have a period at all.. Will send message to Dr. Katrinka Blazing to advise. She is concerned about the brownish discharge, spotting.

## 2011-12-04 ENCOUNTER — Encounter: Payer: Self-pay | Admitting: Family Medicine

## 2011-12-04 ENCOUNTER — Ambulatory Visit (INDEPENDENT_AMBULATORY_CARE_PROVIDER_SITE_OTHER): Payer: Self-pay | Admitting: Family Medicine

## 2011-12-04 VITALS — BP 138/87 | HR 80 | Ht 67.0 in | Wt 203.0 lb

## 2011-12-04 DIAGNOSIS — N921 Excessive and frequent menstruation with irregular cycle: Secondary | ICD-10-CM

## 2011-12-04 MED ORDER — ESTROGENS CONJUGATED 1.25 MG PO TABS: 1.2500 mg | ORAL_TABLET | Freq: Every day | ORAL | Status: DC

## 2011-12-04 NOTE — Assessment & Plan Note (Signed)
P:  Breakthrough bleeding on OCP. Discussed natural history, usually improves with time and treatment options, changing to other forms of contraception (highly recommend Nuva Ring) vs 7 days course of estrogen supplementation.     A: Patient opted for 7 day course of estrogen. Will f/u prn.

## 2011-12-04 NOTE — Progress Notes (Signed)
Subjective:     Patient ID: Katie Dyer, female   DOB: 1986-02-13, 26 y.o.   MRN: [id number]  HPI 26 yo F presents with persistent scant vaginal bleeding and cramping since starting OCPs 3 months ago. She denies vaginal itching, odor or irritation. She denies pre syncope, palpitations and fatigue. She admits to occasional headache. She is not currently sexually active. She is a non-smoker. She has no history of blood clots.   Review of Systems As per HPI    Objective:   Physical Exam   BP 138/87  Pulse 80  Ht 5\' 7"  (1.702 m)  Wt 203 lb (92.08 kg)  BMI 31.79 kg/m2 General appearance: alert, cooperative and no distress Abdomen: soft, non-tender; bowel sounds normal; no masses,  no organomegaly Assessment:     Breakthrough bleeding on OCP. Discussed natural history, usually improves with time and treatment options, changing to other forms of contraception (highly recommend Nuva Ring) vs 7 days course of estrogen supplementation.     Plan:     Patient opted for 7 day course of estrogen. Will f/u prn.

## 2011-12-04 NOTE — Patient Instructions (Signed)
Ms. Katie Dyer,  Thank you for coming to see me today. I have sent in a 7 day course of estrogen. Please take one pill a day for the week.  This should lighten or even stop the bleeding and get you to a more normal cycle.  Keep in mind that the bleeding does usually get better in time. Also keep in mind that there are plenty of other birth control options, like the ring that has a much lower rate of breakthrough bleeding.  Dr. Armen Pickup   F/u as needed

## 2011-12-19 ENCOUNTER — Ambulatory Visit (INDEPENDENT_AMBULATORY_CARE_PROVIDER_SITE_OTHER): Payer: BC Managed Care – PPO | Admitting: Family Medicine

## 2011-12-19 ENCOUNTER — Encounter: Payer: Self-pay | Admitting: Family Medicine

## 2011-12-19 VITALS — BP 122/72 | HR 82 | Temp 98.5°F | Ht 67.0 in | Wt 202.0 lb

## 2011-12-19 DIAGNOSIS — N921 Excessive and frequent menstruation with irregular cycle: Secondary | ICD-10-CM

## 2011-12-19 NOTE — Assessment & Plan Note (Signed)
Discussed options at length with patient. At this time she decided to [facility] would be a good option. Patient is recently married but is not planning on having kids for 3-5 years at least. We will forward note to Dr. Sw[place] to see if she feels more comfortable with her placing it or having me. Patient will make an appointment in 3-4 weeks for placement of IUD as well as Pap smear.

## 2011-12-19 NOTE — Patient Instructions (Signed)
It is good to see you. I think he would do very well with Christean Grief which is a intrauterine device that would help you stopped her periods. This would be something that would last 5 years. I would like you to come back and see either myself or Dr. Sw[place]and in 3-4 weeks and we can do a Pap smear in place this if he would want. We will hold on that gynecology referral for now.

## 2011-12-19 NOTE — Progress Notes (Signed)
  Subjective:    Patient ID: Katie Dyer, female    DOB: 18-Jul-1986, 26 y.o.   MRN: [id number]  HPI 26 year old female who had been recently seen for recurrent breakthrough bleeding while patient has been on oral contraceptives. Patient was seen by Dr. Ova Freshwater just recently was given a seven-day course of Premarin which did stop patient's bleeding. Patient is back on cycle been feeling good but is concerned for this reoccurring. Patient has been taking her medications on a regular basis at the same time denies any significant changes in weight denies much stress. Patient was wanting to know what her options were.   Review of Systems Denies fever, chills, nausea vomiting abdominal pain, dysuria, chest pain, shortness of breath dyspnea on exertion or numbness in extremities     Objective:   Physical Exam  Filed Vitals:   12/19/11 1103  BP: 122/72  Pulse: 82  Temp: 98.5 F (36.9 C)    General appearance: alert, cooperative and no distress Abdomen: soft, non-tender; bowel sounds normal; no masses,  no organomegaly     Assessment & Plan:

## 2012-04-15 ENCOUNTER — Encounter: Payer: Self-pay | Admitting: *Deleted

## 2012-04-15 ENCOUNTER — Other Ambulatory Visit: Payer: Self-pay | Admitting: *Deleted

## 2012-04-15 MED ORDER — NORGESTIMATE-ETH ESTRADIOL 0.25-35 MG-MCG PO TABS: 1.0000 | ORAL_TABLET | Freq: Every day | ORAL | Status: DC

## 2012-04-15 NOTE — Telephone Encounter (Signed)
This encounter was created in error - please disregard.

## 2012-06-07 ENCOUNTER — Ambulatory Visit (INDEPENDENT_AMBULATORY_CARE_PROVIDER_SITE_OTHER): Payer: BC Managed Care – PPO | Admitting: Family Medicine

## 2012-06-07 ENCOUNTER — Encounter: Payer: Self-pay | Admitting: Family Medicine

## 2012-06-07 VITALS — BP 110/68 | HR 112 | Temp 98.6°F | Ht 67.0 in | Wt 203.0 lb

## 2012-06-07 DIAGNOSIS — F329 Major depressive disorder, single episode, unspecified: Secondary | ICD-10-CM

## 2012-06-07 DIAGNOSIS — D649 Anemia, unspecified: Secondary | ICD-10-CM

## 2012-06-07 DIAGNOSIS — Z0000 Encounter for general adult medical examination without abnormal findings: Secondary | ICD-10-CM

## 2012-06-07 DIAGNOSIS — E663 Overweight: Secondary | ICD-10-CM

## 2012-06-07 LAB — LIPID PANEL
Cholesterol: 262 mg/dL — ABNORMAL HIGH (ref 0–200)
Triglycerides: 150 mg/dL — ABNORMAL HIGH (ref <150)
VLDL: 30 mg/dL (ref 0–40)

## 2012-06-07 LAB — CBC
HCT: 38.6 % (ref 36.0–46.0)
Hemoglobin: 13.3 g/dL (ref 12.0–15.0)
MCH: 30.2 pg (ref 26.0–34.0)
MCHC: 34.5 g/dL (ref 30.0–36.0)
RBC: 4.41 MIL/uL (ref 3.87–5.11)

## 2012-06-07 LAB — COMPREHENSIVE METABOLIC PANEL
ALT: 16 U/L (ref 0–35)
CO2: 24 mEq/L (ref 19–32)
Calcium: 8.9 mg/dL (ref 8.4–10.5)
Chloride: 102 mEq/L (ref 96–112)
Glucose, Bld: 75 mg/dL (ref 70–99)
Sodium: 137 mEq/L (ref 135–145)
Total Bilirubin: 0.4 mg/dL (ref 0.3–1.2)
Total Protein: 6.8 g/dL (ref 6.0–8.3)

## 2012-06-07 LAB — TSH: TSH: 0.873 u[IU]/mL (ref 0.350–4.500)

## 2012-06-07 MED ORDER — TRAZODONE HCL 50 MG PO TABS: 50.0000 mg | ORAL_TABLET | Freq: Three times a day (TID) | ORAL | Status: DC

## 2012-06-07 NOTE — Assessment & Plan Note (Addendum)
A: overweight due to sedentary lifestyle and poor diet. Possibly secondary factor like thyroid dysfunction given mood lability and family history. P: -check labs:  TSH, CBC, lipid panel --daily exercise: walking for 20 minutes, yoga, dance etc. For mood elevation and weight management.  -eat 3 meals a day, plenty of water, avoid salt and saturated fats (exchange butter for oil/vegetable oil).

## 2012-06-07 NOTE — Progress Notes (Signed)
Subjective:     Patient ID: Katie Dyer, female   DOB: Jul 26, 1986, 26 y.o.   MRN: [id number]  HPI 26 yo F presents for physical and to discuss the following:  1. Depressed mood/poor sleep: depressed mood since age 8. Denies inciting incident. Has tried Prozac. Has been to a therapist last year x 2 visits. Denies homicidal or suicidal ideation. 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 She admits to putting a lot of pressure on herself. 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 Still has not had sex with her husband of 9 months. She also reports poor sleep x 3 years. She sleeps 4 hrs a night (5 hrs on a good night). She has a strong family history of depression on her mother's side. She denies tobacco, ETOH and illicit drug use.   2. Weight management: concerned about weight fluctuations/inability to lose weight. Does not exercise. Cooks at home. Avoid salts. High fat diet. Skips breakfast. Drinks water and juice.   3. Health maintenance: due for pap smear. Is afraid of the pain. Has decided to postpone.   Review of Systems As per HPI    Objective:   Physical Exam BP 110/68  Pulse 112  Temp 98.6 F (37 C) (Oral)  Ht 5\' 7"  (1.702 m)  Wt 203 lb (92.08 kg)  BMI 31.79 kg/m2 Wt Readings from Last 3 Encounters:  06/07/12 203 lb (92.08 kg)  12/19/11 202 lb (91.627 kg)  12/04/11 203 lb (92.08 kg)   General appearance: alert, cooperative and no distress Neck: no adenopathy, no carotid bruit, no JVD, supple, symmetrical, trachea midline and thyroid not enlarged, symmetric, no tenderness/mass/nodules Lungs: clear to auscultation bilaterally Heart: regular rate and rhythm, S1, S2 normal, no murmur, click, rub or gallop Abdomen: soft, non-tender; bowel sounds normal; no masses,  no organomegaly Extremities: extremities normal, atraumatic, no cyanosis or edema Neurologic:  Grossly normal  PHQ-9:  Score 11 0- 7, 8 and 9. 1- 2, 5 and 6.  2-1.  3-3 and 4.   Somewhat difficult to function.     Assessment and Plan:

## 2012-06-07 NOTE — Patient Instructions (Addendum)
Katie Dyer,  Thank you for coming in today,  1. Insomnia and depressed mood:  -start trazodone 1 tab nightly for  3 days, then twice daily for 3 days, then three time daily.    2. Weight management -check labs: CMP, TSH, CBC, lipid panel --daily exercise: walking for 20 minutes, yoga, dance etc. For mood elevation and weight management.  -eat 3 meals a day, plenty of water, avoid salt and saturated fats (exchange butter for oil/vegetable oil).   F/u with me in 2 weeks.  We will do the pap when you are ready.   Dr. Armen Pickup

## 2012-06-07 NOTE — Assessment & Plan Note (Addendum)
A: depressed mood with insomnia.  P:  trazodone titrate up to TID. Baseline LFTs today.  Referral to mood disorder clinic.  F/u in 2 weeks.

## 2012-06-07 NOTE — Assessment & Plan Note (Signed)
Due for pap smear. Deferred until patient is ready. She plans to attempt intercourse with her husband first.

## 2012-06-09 ENCOUNTER — Encounter: Payer: Self-pay | Admitting: Family Medicine

## 2012-07-23 ENCOUNTER — Ambulatory Visit (INDEPENDENT_AMBULATORY_CARE_PROVIDER_SITE_OTHER): Payer: BC Managed Care – PPO | Admitting: Family Medicine

## 2012-07-23 ENCOUNTER — Encounter: Payer: Self-pay | Admitting: Family Medicine

## 2012-07-23 VITALS — BP 128/84 | HR 94 | Ht 67.0 in | Wt 210.0 lb

## 2012-07-23 DIAGNOSIS — IMO0002 Reserved for concepts with insufficient information to code with codable children: Secondary | ICD-10-CM

## 2012-07-23 DIAGNOSIS — M5430 Sciatica, unspecified side: Secondary | ICD-10-CM

## 2012-07-23 DIAGNOSIS — M5416 Radiculopathy, lumbar region: Secondary | ICD-10-CM

## 2012-07-23 DIAGNOSIS — M5442 Lumbago with sciatica, left side: Secondary | ICD-10-CM | POA: Insufficient documentation

## 2012-07-23 MED ORDER — MELOXICAM 15 MG PO TABS: 15.0000 mg | ORAL_TABLET | Freq: Every day | ORAL | Status: DC

## 2012-07-23 MED ORDER — TRAMADOL HCL 50 MG PO TABS: 50.0000 mg | ORAL_TABLET | Freq: Four times a day (QID) | ORAL | Status: DC | PRN

## 2012-07-23 NOTE — Assessment & Plan Note (Signed)
No red flags for cauda equina syndrome. Given Rx for meloxicam and tramadol. Will f/u PRN.

## 2012-07-23 NOTE — Patient Instructions (Signed)
You might have pinched nerve in your back. Start taking the anti-inflammatory Meloxicam and also the medication called Tramadol as needed. Follow up in 1 week if it has not improved or is worsening.   Take Care,   Dr. Clinton Sawyer
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
## 2012-09-02 ENCOUNTER — Telehealth: Payer: Self-pay | Admitting: Family Medicine

## 2012-09-02 NOTE — Telephone Encounter (Signed)
Patient hasn't heard back from anyone after speaking to the nurse this morning.

## 2012-09-02 NOTE — Telephone Encounter (Signed)
Called patient back. She is having period with cramping. Recommendation  NSAID, heating pad, if pain severe call for office visit. She will most likely stop OCP as she is ready for another baby. Has PE 09/16/12.

## 2012-09-02 NOTE — Telephone Encounter (Signed)
Pt is having some menses problems and has appt on 12/9.  Wants to talk to nurse to see if she needs to wait until then,

## 2012-09-02 NOTE — Telephone Encounter (Signed)
Patient reports the birth control pills ( sprintec )  she is on, she takes 3 weeks  then does not take placebo pills but starts back with new pack. Last menses was 3 months ago or longer. Now has been spotting for 1.5 weeks and past two days  bleeding like a regular period. Has not missed any pills she states. Having cramping and back discomfort that she rates at 7/10. Will forward message to Dr. Armen Pickup.

## 2012-09-16 ENCOUNTER — Other Ambulatory Visit (HOSPITAL_COMMUNITY)
Admission: RE | Admit: 2012-09-16 | Discharge: 2012-09-16 | Disposition: A | Discharge status: Home / Self Care | Payer: BC Managed Care – PPO | Source: Ambulatory Visit | Attending: Family Medicine | Admitting: Family Medicine

## 2012-09-16 ENCOUNTER — Encounter: Payer: Self-pay | Admitting: Family Medicine

## 2012-09-16 ENCOUNTER — Ambulatory Visit (INDEPENDENT_AMBULATORY_CARE_PROVIDER_SITE_OTHER): Payer: BC Managed Care – PPO | Admitting: Family Medicine

## 2012-09-16 VITALS — BP 144/86 | HR 120 | Ht 67.0 in | Wt 207.0 lb

## 2012-09-16 DIAGNOSIS — N949 Unspecified condition associated with female genital organs and menstrual cycle: Secondary | ICD-10-CM

## 2012-09-16 DIAGNOSIS — L918 Other hypertrophic disorders of the skin: Secondary | ICD-10-CM | POA: Insufficient documentation

## 2012-09-16 DIAGNOSIS — L309 Dermatitis, unspecified: Secondary | ICD-10-CM

## 2012-09-16 DIAGNOSIS — L259 Unspecified contact dermatitis, unspecified cause: Secondary | ICD-10-CM

## 2012-09-16 DIAGNOSIS — R102 Pelvic and perineal pain: Secondary | ICD-10-CM

## 2012-09-16 DIAGNOSIS — Z113 Encounter for screening for infections with a predominantly sexual mode of transmission: Secondary | ICD-10-CM | POA: Insufficient documentation

## 2012-09-16 DIAGNOSIS — Z23 Encounter for immunization: Secondary | ICD-10-CM

## 2012-09-16 DIAGNOSIS — Z01419 Encounter for gynecological examination (general) (routine) without abnormal findings: Secondary | ICD-10-CM | POA: Insufficient documentation

## 2012-09-16 DIAGNOSIS — L909 Atrophic disorder of skin, unspecified: Secondary | ICD-10-CM

## 2012-09-16 DIAGNOSIS — R21 Rash and other nonspecific skin eruption: Secondary | ICD-10-CM | POA: Insufficient documentation

## 2012-09-16 DIAGNOSIS — J45909 Unspecified asthma, uncomplicated: Secondary | ICD-10-CM

## 2012-09-16 DIAGNOSIS — Z124 Encounter for screening for malignant neoplasm of cervix: Secondary | ICD-10-CM

## 2012-09-16 DIAGNOSIS — M654 Radial styloid tenosynovitis [de Quervain]: Secondary | ICD-10-CM

## 2012-09-16 DIAGNOSIS — L219 Seborrheic dermatitis, unspecified: Secondary | ICD-10-CM

## 2012-09-16 DIAGNOSIS — L919 Hypertrophic disorder of the skin, unspecified: Secondary | ICD-10-CM

## 2012-09-16 MED ORDER — FLUTICASONE-SALMETEROL 100-50 MCG/DOSE IN AEPB: 1.0000 | INHALATION_SPRAY | Freq: Two times a day (BID) | RESPIRATORY_TRACT | Status: DC

## 2012-09-16 MED ORDER — TRIAMCINOLONE ACETONIDE 0.1 % EX CREA: TOPICAL_CREAM | Freq: Two times a day (BID) | CUTANEOUS | Status: DC

## 2012-09-16 NOTE — Assessment & Plan Note (Signed)
A: benign R upper back skin tag. P: removal prn pain/patient preference.

## 2012-09-16 NOTE — Progress Notes (Signed)
Subjective:     Patient ID: Katie Dyer, female   DOB: 1986/08/30, 26 y.o.   MRN: [id number]  HPI 26 yo F presents for f/u appointment to discuss the following:  1. Health maintenance: due for pap smear. Married but has not been sexually active with her husband. Denies pelvic pain and vaginal discharge. Also due for flu shot.   2. R wrist pain:x 2 weeks. No injury. Radial styloid.   3. Dry scalp and eyebrows.   4. R upper back mole.   Review of Systems Patient Information Form: Screening and ROS  AUDIT-C Score: 1 Do you feel safe in relationships? yes PHQ-2:positive  Review of Symptoms  General:  Negative for nexplained weight loss, fever Skin: Negative for new or changing mole, sore that won't heal HEENT: Negative for trouble hearing, trouble seeing, ringing in ears, mouth sores, hoarseness, change in voice, dysphagia. CV:  Negative for chest pain, dyspnea, edema, palpitations Resp: Negative for cough, dyspnea, hemoptysis GI: Positive for constipation and blood in stool. Negative for nausea, vomiting, diarrhea, abdominal pain, ematochezia. GU: Positive for dark vaginal discharge. Negative for dysuria, incontinence, urinary hesitance, hematuria,  polyuria, sexual difficulty, lumps in testicle or breasts MSK: Positive for muscle cramps or aches, negative joint pain or swelling Neuro: Negative for headaches, weakness, numbness, dizziness, passing out/fainting Psych: Positive for anxiety. Negative for depression, memory problems    Objective:   Physical Exam BP 144/86  Pulse 120  Ht 5\' 7"  (1.702 m)  Wt 207 lb (93.895 kg)  BMI 32.42 kg/m2 General appearance: alert, cooperative and no distress Abdomen: soft, non-tender; bowel sounds normal; no masses,  no organomegaly Pelvic: cervix normal in appearance, external genitalia normal, no adnexal masses or tenderness, no cervical motion tenderness, rectovaginal septum normal, uterus normal size, shape, and consistency and vagina  normal without discharge. Normal rectal exam. FONT negative.  Extremities: R wrist: slight tenderness and swelling over radial styloid. No redness. + Finkelstein test. 2+ radial pulse.  Skin: flesh colored papule R upper back. Erythematous xerotic plaques lower breast.     Assessment and Plan:

## 2012-09-16 NOTE — Assessment & Plan Note (Signed)
selsum blue

## 2012-09-16 NOTE — Assessment & Plan Note (Signed)
1. Ice 15 minutes 2-3 times daily 2. Wear splint: thumb spica

## 2012-09-16 NOTE — Assessment & Plan Note (Signed)
A: breast eczematous dermatitis. P: kenalog.

## 2012-09-16 NOTE — Patient Instructions (Addendum)
Katie Dyer,   Thank you for coming in today.   For results:  I will have my staff call if anything abnornmal or send a letter is all normal. You can also check your results on my chart.    For your R thumb: 1. Ice 15 minutes 2-3 times daily 2. Wear splint   For dry scalp and eye brows: selsum blue twice weekly  For breast rash: use steroid cream.  F/u in 4 weeks of R thumb pain is persistent.   Dr. Brooke Pace Quervain's Disease Katie Dyer disease is a condition often seen in racquet sports where there is a soreness (inflammation) in the cord like structures (tendons) which attach muscle to bone on the thumb side of the wrist. There may be a tightening of the tissuesaround the tendons. This condition is often helped by giving up or modifying the activity which caused it. When conservative treatment does not help, surgery may be required. Conservative treatment could include changes in the activity which brought about the problem or made it worse. Anti-inflammatory medications and injections may be used to help decrease the inflammation and help with pain control. Your caregiver will help you determine which is best for you. DIAGNOSIS  Often the diagnosis (learning what is wrong) can be made by examination. Sometimes x-rays are required. HOME CARE INSTRUCTIONS   Apply ice to the sore area for 15 to 20 minutes, 3 to 4 times per day while awake. Put the ice in a plastic bag and place a towel between the bag of ice and your skin. This is especially helpful if it can be done after all activities involving the sore wrist.  Temporary splinting may help.  Only take over-the-counter or prescription medicines for pain, discomfort or fever as directed by your caregiver. SEEK MEDICAL CARE IF:   Pain relief is not obtained with medications, or if you have increasing pain and seem to be getting worse rather than better. MAKE SURE YOU:   Understand these instructions.  Will watch your  condition.  Will get help right away if you are not doing well or get worse. Document Released: 06/20/2001 Document Revised: 12/18/2011 Document Reviewed: 09/25/2005 [facility] Patient Information 2013 [place], [place].

## 2012-09-20 ENCOUNTER — Encounter: Payer: Self-pay | Admitting: Family Medicine

## 2012-09-23 ENCOUNTER — Encounter: Payer: Self-pay | Admitting: Family Medicine

## 2012-10-22 ENCOUNTER — Other Ambulatory Visit: Payer: Self-pay | Admitting: *Deleted

## 2012-10-22 MED ORDER — NORGESTIMATE-ETH ESTRADIOL 0.25-35 MG-MCG PO TABS: 1.0000 | ORAL_TABLET | Freq: Every day | ORAL | Status: DC

## 2012-10-28 ENCOUNTER — Ambulatory Visit (INDEPENDENT_AMBULATORY_CARE_PROVIDER_SITE_OTHER): Payer: BC Managed Care – PPO | Admitting: Family Medicine

## 2012-10-28 ENCOUNTER — Encounter: Payer: Self-pay | Admitting: Family Medicine

## 2012-10-28 VITALS — BP 127/87 | HR 85 | Temp 98.4°F | Wt 207.0 lb

## 2012-10-28 DIAGNOSIS — IMO0002 Reserved for concepts with insufficient information to code with codable children: Secondary | ICD-10-CM

## 2012-10-28 DIAGNOSIS — M654 Radial styloid tenosynovitis [de Quervain]: Secondary | ICD-10-CM

## 2012-10-28 DIAGNOSIS — M5416 Radiculopathy, lumbar region: Secondary | ICD-10-CM

## 2012-10-28 MED ORDER — IBUPROFEN 800 MG PO TABS: 800.0000 mg | ORAL_TABLET | Freq: Three times a day (TID) | ORAL | Status: DC | PRN

## 2012-10-28 MED ORDER — TRAMADOL HCL 50 MG PO TABS: 100.0000 mg | ORAL_TABLET | Freq: Three times a day (TID) | ORAL | Status: DC | PRN

## 2012-10-28 NOTE — Progress Notes (Signed)
  Subjective:    Patient ID: [name], female    DOB: 1986/07/05, 27 y.o.   MRN: [id number]  HPI  1.  Right thumb pain:  Present for past 6 weeks.  Placed in Spica thumb splint and started on Mobic/Tramadol about 4 weeks ago.  Has not had much relief from either.  Taking tramadol 50 mg once or twice a day.  Using ice for relief.  Wears splint at night and much of day.  Notes pain when bending right or trying to pick something up.  No loss of strength, paresthesias.  No trauma or instigating incident.   Review of Systems See HPI above for review of systems.       Objective:   Physical Exam  Gen:  Alert, cooperative patient who appears stated age in no acute distress.  Vital signs reviewed. MSK:  TTP along Right medial aspect of wrist from base of 1st metacarpal to 1-2 cm proximal from radial stylus.  No one area of maximal pain.  Strength 4/5 Right thumb, limited by pain, compared to 5/5 Left thumb opposition. Handgrip 5/5 BL      Assessment & Plan:

## 2012-10-28 NOTE — Patient Instructions (Addendum)
You can take 2 of the Tramadol for pain relief, up to 3 times a day.  You can also take the Ibuprofen 800 mg 2-3 times a day for inflammation.    On your way out make an appointment for Sports Medicine for your thumb.  I hope you feel better, it was good to see you

## 2012-10-28 NOTE — Assessment & Plan Note (Signed)
Increase to 100 mg Tramadol for pain relief. Can try Ibuprofen 800 mg as anti-inflammatory, no help with Mobic. Patient would like to try injection at this spot. Referred down to Sports Medicine for US-guided corticosteroid injection.  Warnings against hypopigmentation provided.   Continue splint and ice until seen down there.

## 2012-11-01 ENCOUNTER — Ambulatory Visit (INDEPENDENT_AMBULATORY_CARE_PROVIDER_SITE_OTHER): Payer: BC Managed Care – PPO | Admitting: Family Medicine

## 2012-11-01 VITALS — BP 115/82 | Ht 67.0 in | Wt 206.0 lb

## 2012-11-01 DIAGNOSIS — M654 Radial styloid tenosynovitis [de Quervain]: Secondary | ICD-10-CM

## 2012-11-01 NOTE — Progress Notes (Signed)
  Subjective:    Patient ID: Katie Dyer, female    DOB: [birth year], 27 y.o.   MRN: [id number]  HPI  Right wrist pain x6 weeks. Right-hand dominant. No specific injury. Pain with grasping and picking things up. Was seen by her primary care provider and placed in a cockup wrist splint which has not seemed to help.  Review of Systems Denies nighttime pain. No wrist swelling.    Objective:   Physical Exam Vital signs are reviewed GENERAL: Well-developed female no acute distress WRISTS: Right. Full range of motion in flexion extension. Deviation ulnar and radial sides full. Positive Finkelstein test. Ultrasound:  Of fluid in the area of her the EPB . Question of split APL vs anomaly.       Assessment & Plan:  #1. De Quervain's tenosynovitis. Place in universal thumb splint. See her back in 3 weeks.

## 2012-11-25 ENCOUNTER — Encounter: Payer: Self-pay | Admitting: Family Medicine

## 2012-11-25 ENCOUNTER — Ambulatory Visit (INDEPENDENT_AMBULATORY_CARE_PROVIDER_SITE_OTHER): Payer: BC Managed Care – PPO | Admitting: Family Medicine

## 2012-11-25 VITALS — BP 121/79 | HR 93 | Ht 67.0 in | Wt 206.0 lb

## 2012-11-25 DIAGNOSIS — M654 Radial styloid tenosynovitis [de Quervain]: Secondary | ICD-10-CM

## 2012-11-25 NOTE — Progress Notes (Signed)
  Subjective:    Patient ID: Katie Dyer, female    DOB: 1986/04/24, 27 y.o.   MRN: [id number]  HPI Followup right thumb pain. We placed her in universal thumb splint and she has been wearing that. No improvement in her pain. Continues to hurt with any type of activity even with the splint on.   Review of Systems Denies numbness, fever, skin change on the thumb.    Objective:   Physical Exam Vital signs are reviewed GENERAL well-developed female no acute distress THUMB: Right. Full range of motion and full strength in all planes of thumb motion. She has pain with resisted extension.  INJECTION: Patient was given informed consent, signed copy in the chart. Appropriate time out was taken. Area prepped and draped in usual sterile fashion. One half cc of methylprednisolone 40 mg/ml plus  One cc of 1% lidocaine without epinephrine was injected into the Tendon sheath of the APL using a(n) Distal approach. The patient tolerated the procedure well. There were no complications. Post procedure instructions were given.        Assessment & Plan:  #1. Equal air veins tenosynovitis plan: Corticosteroid injection today. Continue universal thumb splint. I expect her to start feeling better midweek him and we gave her an exercise handout to start if she's not feeling better in 7-10 days at all and unable to start the exercises we will then refer her to insert at her request.

## 2013-03-10 ENCOUNTER — Ambulatory Visit (INDEPENDENT_AMBULATORY_CARE_PROVIDER_SITE_OTHER): Payer: BC Managed Care – PPO | Admitting: Family Medicine

## 2013-03-10 VITALS — BP 136/92 | HR 96 | Temp 98.1°F | Ht 67.0 in | Wt 204.0 lb

## 2013-03-10 DIAGNOSIS — J45909 Unspecified asthma, uncomplicated: Secondary | ICD-10-CM

## 2013-03-10 DIAGNOSIS — J301 Allergic rhinitis due to pollen: Secondary | ICD-10-CM

## 2013-03-10 MED ORDER — ALBUTEROL SULFATE HFA 108 (90 BASE) MCG/ACT IN AERS: 2.0000 | INHALATION_SPRAY | RESPIRATORY_TRACT | Status: DC | PRN

## 2013-03-10 MED ORDER — CETIRIZINE HCL 10 MG PO TABS: 10.0000 mg | ORAL_TABLET | Freq: Every day | ORAL | Status: DC

## 2013-03-10 MED ORDER — FLUTICASONE PROPIONATE 50 MCG/ACT NA SUSP: 2.0000 | Freq: Every day | NASAL | Status: DC

## 2013-03-10 MED ORDER — FLUTICASONE-SALMETEROL 100-50 MCG/DOSE IN AEPB: 1.0000 | INHALATION_SPRAY | Freq: Two times a day (BID) | RESPIRATORY_TRACT | Status: DC

## 2013-03-10 NOTE — Assessment & Plan Note (Signed)
Refilled advair and albuterol.

## 2013-03-10 NOTE — Progress Notes (Signed)
Subjective:    Katie Dyer is a 27 y.o. female who presents for evaluation of symptoms of a URI. Symptoms include cough described as productive, nasal congestion, no  fever, post nasal drip, sore throat and wheezing. Onset of symptoms was 5 days ago, and has been gradually improving since that time. Treatment to date: none.  Review of Systems Parents deny fever, difficulty breathing, rash, poor fluid intake or decreased urine output  Objective:   BP 136/92  Pulse 96  Temp(Src) 98.1 F (36.7 C) (Oral)  Ht 5\' 7"  (1.702 m)  Wt 204 lb (92.534 kg)  BMI 31.94 kg/m2  LMP 02/16/2013 General appearance: alert, cooperative, no distress Eyes: PERRL, EOMIT, Conjunctiva Pink Ears: normal TM's and external ear canals both ears Nose: Clear nasal discharge, turbinates pale, mild swelling Throat: Oral mucosa moist no lesions, tonsils without exudates Lungs: Normal work of breathing, lungs clear throughout, no wheezes or rales Heart: regular rate and rhythm, S1, S2 normal, no murmur, click, rub or gallop Extremities: 2+ pulses Assessment & Plan:

## 2013-03-10 NOTE — Assessment & Plan Note (Signed)
Rx Flonase and cetirizine, discussed avoiding allergens.

## 2013-03-10 NOTE — Patient Instructions (Signed)
Hay Fever  ° ° °Hay fever is a type of allergy that people have to things like grass, animals, or pollen from plants and flowers. It cannot be passed from one person to another. You cannot cure hay fever, but there are things that may help relieve your problems (symptoms).  °HOME CARE  °Avoid the things that may be causing your problems.  °Take all medicine as told by your doctor. °GET HELP RIGHT AWAY IF:  °You have asthma, a cough, and you start making whistling sounds when breathing (wheezing).  °Your tongue or lips are puffy (swollen).  °You have trouble breathing.  °You feel lightheaded or like you will pass out (faint).  °You have a fever.  °Your problems are getting worse and your medicine is not helping.  °Your treatment was working, but your problems have come back.  °You are stuffed up (congested) and have pressure in your face.  °You have a headache.  °You have cold sweats. °MAKE SURE YOU:  °Understand these instructions.  °Will watch your condition.  °Will get help right away if you are not doing well or get worse. °Document Released: 01/25/2011 Document Revised: 12/18/2011 Document Reviewed: 01/25/2011  °[employer]® Patient Information ©2014 [employer]. ° °

## 2013-05-02 ENCOUNTER — Ambulatory Visit (INDEPENDENT_AMBULATORY_CARE_PROVIDER_SITE_OTHER): Payer: BC Managed Care – PPO | Admitting: Family Medicine

## 2013-05-02 ENCOUNTER — Encounter: Payer: Self-pay | Admitting: Family Medicine

## 2013-05-02 VITALS — BP 131/85 | HR 96 | Temp 98.6°F | Ht 67.0 in | Wt 214.0 lb

## 2013-05-02 DIAGNOSIS — R590 Localized enlarged lymph nodes: Secondary | ICD-10-CM

## 2013-05-02 DIAGNOSIS — R599 Enlarged lymph nodes, unspecified: Secondary | ICD-10-CM

## 2013-05-02 NOTE — Patient Instructions (Signed)
Thank you for coming in, today! The bumps under your arms are most likely lymph nodes. They can swell in response to infection/cuts/scrapes and so on. I am unsure what is causing them to swell this time, but there does not appear to be anything worrisome, now. You can continue to use warm compresses as needed. Make an appointment to see Dr. Konrad Dolores in about two weeks.    He can talk to you about your medications and any other problems, as well as follow up these bumps.    If the swelling places get larger, or if they don't go away in another week to two weeks, come back sooner.    If you develop any fever/chills, nausea/vomiting, redness, bleeding, or drainage, come back sooner. Please feel free to call with any questions or concerns at any time, at [phone]. --Dr. Casper Harrison  Lymphadenopathy Lymphadenopathy means "disease of the lymph glands." But the term is usually used to describe swollen or enlarged lymph glands, also called lymph nodes. These are the bean-shaped organs found in many locations including the neck, underarm, and groin. Lymph glands are part of the immune system, which fights infections in your body. Lymphadenopathy can occur in just one area of the body, such as the neck, or it can be generalized, with lymph node enlargement in several areas. The nodes found in the neck are the most common sites of lymphadenopathy. CAUSES  When your immune system responds to germs (such as viruses or bacteria ), infection-fighting cells and fluid build up. This causes the glands to grow in size. This is usually not something to worry about. Sometimes, the glands themselves can become infected and inflamed. This is called lymphadenitis. Enlarged lymph nodes can be caused by many diseases:  Bacterial disease, such as strep throat or a skin infection.  Viral disease, such as a common cold.  Other germs, such as lyme disease, tuberculosis, or sexually transmitted diseases.  Cancers, such as  lymphoma (cancer of the lymphatic system) or leukemia (cancer of the white blood cells).  Inflammatory diseases such as lupus or rheumatoid arthritis.  Reactions to medications. Many of the diseases above are rare, but important. This is why you should see your caregiver if you have lymphadenopathy. SYMPTOMS   Swollen, enlarged lumps in the neck, back of the head or other locations.  Tenderness.  Warmth or redness of the skin over the lymph nodes.  Fever. DIAGNOSIS  Enlarged lymph nodes are often near the source of infection. They can help healthcare providers diagnose your illness. For instance:   Swollen lymph nodes around the jaw might be caused by an infection in the mouth.  Enlarged glands in the neck often signal a throat infection.  Lymph nodes that are swollen in more than one area often indicate an illness caused by a virus. Your caregiver most likely will know what is causing your lymphadenopathy after listening to your history and examining you. Blood tests, x-rays or other tests may be needed. If the cause of the enlarged lymph node cannot be found, and it does not go away by itself, then a biopsy may be needed. Your caregiver will discuss this with you. TREATMENT  Treatment for your enlarged lymph nodes will depend on the cause. Many times the nodes will shrink to normal size by themselves, with no treatment. Antibiotics or other medicines may be needed for infection. Only take over-the-counter or prescription medicines for pain, discomfort or fever as directed by your caregiver. HOME CARE INSTRUCTIONS  Swollen  lymph glands usually return to normal when the underlying medical condition goes away. If they persist, contact your health-care provider. He/she might prescribe antibiotics or other treatments, depending on the diagnosis. Take any medications exactly as prescribed. Keep any follow-up appointments made to check on the condition of your enlarged nodes.  SEEK MEDICAL  CARE IF:   Swelling lasts for more than two weeks.  You have symptoms such as weight loss, night sweats, fatigue or fever that does not go away.  The lymph nodes are hard, seem fixed to the skin or are growing rapidly.  Skin over the lymph nodes is red and inflamed. This could mean there is an infection. SEEK IMMEDIATE MEDICAL CARE IF:   Fluid starts leaking from the area of the enlarged lymph node.  You develop a fever of 102 F (38.9 C) or greater.  Severe pain develops (not necessarily at the site of a large lymph node).  You develop chest pain or shortness of breath.  You develop worsening abdominal pain. MAKE SURE YOU:   Understand these instructions.  Will watch your condition.  Will get help right away if you are not doing well or get worse. Document Released: 07/04/2008 Document Revised: 12/18/2011 Document Reviewed: 07/04/2008 [facility] Patient Information 2014 [place], [place].

## 2013-05-06 DIAGNOSIS — R590 Localized enlarged lymph nodes: Secondary | ICD-10-CM | POA: Insufficient documentation

## 2013-05-06 NOTE — Assessment & Plan Note (Signed)
A: Precepted with Dr. Armen Pickup. Present bilaterally, left slightly worse than right but mild on both sides, tender and non-fixed nodes without obvious lesions to upper arms/hands, bilateral trunk/breasts, or bilateral axillae themselves. No systemic signs/symptoms to suggest infection. No breast symptoms or findings to suggest mastitis or other acute process.  P: Recommended warm compresses and surveillance for now. Advised follow up with PCP as needed. If persists or worsens, could consider basic labwork to evaluate for occult infection and/or could consider diagnostic mammogram to evaluate for breast pathology. Will forward this note to PCP, Dr. Konrad Dolores.

## 2013-05-06 NOTE — Progress Notes (Signed)
  Subjective:    Patient ID: Katie Dyer, female    DOB: 1985/12/16, 27 y.o.   MRN: [id number]  HPI: Pt presents to clinic for "bumps" under both arms, for about 9 days. Pt states the bumps/knots began under her right arm as a single knot that went away on its own after about two days, with more, smaller knots coming up under her left arm the next day and persisting until this visit (9 days later); bump under right arm has come back, as well. Bumps are not open to the skin and pt has had no drainage or bleeding. Bumps are "swollen, tender, painful," and more or less unchanged since starting. Pt states she had similar bumps under her arms about 3 years ago, which resolved with only warm compresses after a few days, but they were "not as bad" as they are now. Pt has been using warm compresses on the current bumps with no relief. Pt has taken no new or different medications. Pt has stopped shaving her underarms and has not been using deodorant, thinking these may have contributed. Pt has no fevers/chillls, N/V, cough, sinus congestion, upper extremity or hand rash/lesions, other pain in her body, or body rash. Pt has noted no breast swelling or pain, no nipple discharge or bleeding, no masses in her breasts or changes to her breast skin. Pt does endorse occasional "tingling" of her left breast and side up into her her axilla, but very mild and occasional; pt cannot further characterize sensation in terms of descriptors, timing, severity, trigger/relieving factors, or otherwise.  Review of Systems: As above.     Objective:   Physical Exam BP 131/85  Pulse 96  Temp(Src) 98.6 F (37 C) (Oral)  Ht 5\' 7"  (1.702 m)  Wt 214 lb (97.07 kg)  BMI 33.51 kg/m2  LMP 04/11/2013 Gen: well-appearing but slightly anxious adult female in NAD, very pleasant Axillae: bilateral lymphadenopathy  Left: multiple sub-centimeter, tender, nonfluctuant, non-fixed, mobile lymph nodes; no break in overlying skin, no  erythema/edema  Right: single ~1.5 cm tender, nonfluctuant, non-fixed, mobile lymph node without break in overlying skin; no surrounding erythema/edema Breasts: visually normal skin and nipples without retraction or discharge; no asymmetry or obvious masses, no tenderness or rash of breast skin or intertriginous skin Pulm: CTAB, no wheezes/rhonchi/rales Cardio: RRR, no murmur appreciated HEENT: TMs clear bilaterally, MMM, no post oropharyngeal or nasal mucosal edema/erythema Ext: bilateral upper extremities normal with good grip strength, normal distal pulses, no cyanosis/clubbing/edema or rash, no skin breaks or other lesions     Assessment & Plan:

## 2013-05-20 ENCOUNTER — Encounter: Payer: Self-pay | Admitting: Family Medicine

## 2013-05-20 ENCOUNTER — Ambulatory Visit (INDEPENDENT_AMBULATORY_CARE_PROVIDER_SITE_OTHER): Payer: BC Managed Care – PPO | Admitting: Family Medicine

## 2013-05-20 VITALS — BP 125/79 | HR 93 | Temp 98.6°F | Ht 67.0 in | Wt 214.0 lb

## 2013-05-20 DIAGNOSIS — L309 Dermatitis, unspecified: Secondary | ICD-10-CM

## 2013-05-20 DIAGNOSIS — R599 Enlarged lymph nodes, unspecified: Secondary | ICD-10-CM

## 2013-05-20 DIAGNOSIS — L259 Unspecified contact dermatitis, unspecified cause: Secondary | ICD-10-CM

## 2013-05-20 DIAGNOSIS — R590 Localized enlarged lymph nodes: Secondary | ICD-10-CM

## 2013-05-20 DIAGNOSIS — F329 Major depressive disorder, single episode, unspecified: Secondary | ICD-10-CM

## 2013-05-20 DIAGNOSIS — R21 Rash and other nonspecific skin eruption: Secondary | ICD-10-CM

## 2013-05-20 LAB — CBC WITH DIFFERENTIAL/PLATELET
Basophils Absolute: 0 10*3/uL (ref 0.0–0.1)
Eosinophils Relative: 2 % (ref 0–5)
HCT: 37.3 % (ref 36.0–46.0)
Hemoglobin: 13.1 g/dL (ref 12.0–15.0)
Lymphocytes Relative: 42 % (ref 12–46)
Lymphs Abs: 2.9 10*3/uL (ref 0.7–4.0)
MCV: 86.1 fL (ref 78.0–100.0)
Monocytes Absolute: 0.7 10*3/uL (ref 0.1–1.0)
Monocytes Relative: 11 % (ref 3–12)
Neutro Abs: 3.2 10*3/uL (ref 1.7–7.7)
RBC: 4.33 MIL/uL (ref 3.87–5.11)
WBC: 6.9 10*3/uL (ref 4.0–10.5)

## 2013-05-20 LAB — SEDIMENTATION RATE: Sed Rate: 12 mm/hr (ref 0–22)

## 2013-05-20 MED ORDER — TRAZODONE HCL 50 MG PO TABS: 50.0000 mg | ORAL_TABLET | Freq: Three times a day (TID) | ORAL | Status: DC

## 2013-05-20 MED ORDER — TRIAMCINOLONE ACETONIDE 0.1 % EX CREA: TOPICAL_CREAM | Freq: Two times a day (BID) | CUTANEOUS | Status: DC

## 2013-05-20 NOTE — Patient Instructions (Addendum)
You are doing well overal Please only use the ointment on your face for 2 weeks I have refilled the trazadone Please come back to see me in 1 month to follow up on your other medical conditions or sooner if needed I'll let you know if any of your lab work comes back abnormal Please go to the breast center for a mamogram

## 2013-05-20 NOTE — Progress Notes (Signed)
Katie Dyer is a 27 y.o. female who presents to [facility] today for f/u for armpit lumps  Rash: on face for several years. Uses triamcinolone cream prn w/ some benefit. Has tried several other OTC treatments w/o success such as A&D and mineral oint. Present primarily in malar type distribution. Family h/o RA adn sjogrens in maternal Grandmother.  Lumps under arms now resolved. Under L armpit resolved yesterday after 4 wks and R arm resolved 2 days ago after 2 wks. Had been painful. Denies any breast lumps or discharge.  The following portions of the patient's history were reviewed and updated as appropriate: allergies, current medications, past medical history, family and social history, and problem list.  Patient is a nonsmoker.  Past Medical History  Diagnosis Date  . Asthma   . Intestinal polyps   . Depression 2003  . Insomnia 2010  . RECTAL POLYPS 07/25/2007    Qualifier: Diagnosis of  By: Constance Goltz MD, Molli Hazard    . MENSTRUATION, PAINFUL 12/06/2006    Qualifier: Diagnosis of  By: Levada Schilling    . Menorrhagia 09/19/2011    ROS as above otherwise neg.    Medications reviewed. Current Outpatient Prescriptions  Medication Sig Dispense Refill  . traZODone (DESYREL) 50 MG tablet Take 1 tablet (50 mg total) by mouth 3 (three) times daily.  90 tablet  1  . triamcinolone cream (KENALOG) 0.1 % Apply topically 2 (two) times daily. Only treat for 2 weeks at a time  30 g  0  . acetaminophen (TYLENOL) 500 MG tablet Take 500 mg by mouth every 6 (six) hours as needed. Takes for pain       . albuterol (PROVENTIL HFA;VENTOLIN HFA) 108 (90 BASE) MCG/ACT inhaler Inhale 2 puffs into the lungs every 4 (four) hours as needed. Shortness of breath  2 Inhaler  1  . cetirizine (ZYRTEC) 10 MG tablet Take 1 tablet (10 mg total) by mouth daily.  30 tablet  11  . fluticasone (FLONASE) 50 MCG/ACT nasal spray Place 2 sprays into the nose daily.  16 g  6  . Fluticasone-Salmeterol (ADVAIR) 100-50 MCG/DOSE AEPB Inhale 1  puff into the lungs 2 (two) times daily.  1 each  3  . ibuprofen (ADVIL,MOTRIN) 800 MG tablet Take 1 tablet (800 mg total) by mouth every 8 (eight) hours as needed for pain.  30 tablet  1   No current facility-administered medications for this visit.    Exam:  BP 125/79  Pulse 93  Temp(Src) 98.6 F (37 C) (Oral)  Ht 5\' 7"  (1.702 m)  Wt 214 lb (97.07 kg)  BMI 33.51 kg/m2  LMP 04/11/2013 Gen: Well NAD HEENT: EOMI,  MMM Lungs: CTABL Nl WOB Heart: RRR no MRG Abd: NABS, NT, ND Exts: Non edematous BL  LE, warm and well perfused.  Breast: no masses, or tenderness Lymph: no palpable or tender lymph nodes in axillary or clavicle Skin: mild pink scaling rash along upper cheeks to bridge of nose and mildly affecting the upper eyebrow region.  Results for orders placed in visit on 05/20/13 (from the past 72 hour(s))  ANTI-DNA ANTIBODY, DOUBLE-STRANDED     Status: None   Collection Time    05/20/13  4:50 PM      Result Value Range   ds DNA Ab 3  <30 IU/mL   Comment:              <  30 IU/mL     Negative  30-40 IU/mL     Equivocal                  >  40 IU/mL     Positive  SEDIMENTATION RATE     Status: None   Collection Time    05/20/13  4:50 PM      Result Value Range   Sed Rate 12  0 - 22 mm/hr  CBC WITH DIFFERENTIAL     Status: None   Collection Time    05/20/13  4:50 PM      Result Value Range   WBC 6.9  4.0 - 10.5 K/uL   RBC 4.33  3.87 - 5.11 MIL/uL   Hemoglobin 13.1  12.0 - 15.0 g/dL   HCT 95.6  21.3 - 08.6 %   MCV 86.1  78.0 - 100.0 fL   MCH 30.3  26.0 - 34.0 pg   MCHC 35.1  30.0 - 36.0 g/dL   RDW 57.8  46.9 - 62.9 %   Platelets 391  150 - 400 K/uL   Neutrophils Relative % 45  43 - 77 %   Neutro Abs 3.2  1.7 - 7.7 K/uL   Lymphocytes Relative 42  12 - 46 %   Lymphs Abs 2.9  0.7 - 4.0 K/uL   Monocytes Relative 11  3 - 12 %   Monocytes Absolute 0.7  0.1 - 1.0 K/uL   Eosinophils Relative 2  0 - 5 %   Eosinophils Absolute 0.1  0.0 - 0.7 K/uL    Basophils Relative 0  0 - 1 %   Basophils Absolute 0.0  0.0 - 0.1 K/uL   Smear Review Criteria for review not met    ANA     Status: None   Collection Time    05/20/13  4:50 PM      Result Value Range   ANA NEG  NEGATIVE

## 2013-05-21 ENCOUNTER — Telehealth: Payer: Self-pay | Admitting: Family Medicine

## 2013-05-21 DIAGNOSIS — R590 Localized enlarged lymph nodes: Secondary | ICD-10-CM

## 2013-05-21 MED ORDER — KETOCONAZOLE 2 % EX CREA: TOPICAL_CREAM | Freq: Every day | CUTANEOUS | Status: DC

## 2013-05-21 NOTE — Telephone Encounter (Signed)
Order placed. Please inform pt.

## 2013-05-21 NOTE — Telephone Encounter (Signed)
Spoke to Dr. Jennette Kettle concerning case. She feels imaging is not indicated at this time despite pt presentation and fmhx. Recommending pt find out more about family members breast cancer such as receptor types and any genetic testing they may have had done. No need for imaging at this time.

## 2013-05-21 NOTE — Telephone Encounter (Signed)
Called pt to make her aware that order was placed and she states that [facility] wants to find out why you think a mammogram is needed.  They mentioned that she might need an ultrasound first due to her age.  Can you please call them at [phone]?

## 2013-05-21 NOTE — Telephone Encounter (Signed)
Will forward to MD to place orders. Julya Alioto,CMA  

## 2013-05-21 NOTE — Telephone Encounter (Signed)
Pt went to have her mammogram done and they would not do it because they didn't see any orders from Dr. Konrad Dolores and they wanted him to send in orders. JW

## 2013-05-21 NOTE — Telephone Encounter (Signed)
Message given to patient and she is fine with this.  Also spoke with Baird Lyons at [facility] and they will only do a mammogram on someone her age if there is an immediate family hx.  Such as sister or mother.  Pt is also aware of this too.  Katie Dyer,CMA

## 2013-05-21 NOTE — Assessment & Plan Note (Addendum)
Strong fm hx of breast ca No nodes today.  After discussing w/ Preceptors Dr. Jennette Kettle and Randolm Idol, recommending obtain infrmoation regarding family members breast cancer and receptor/genetic testing No need for imaging at this time.   - support staff to call pt---

## 2013-05-21 NOTE — Assessment & Plan Note (Signed)
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 Concern for lupus given distribution and recent odd lymphadnopathy Previous treatments w/ seleium sulfide, kenalog ointment w/o resolution Concern for prolonged steroid therapy and epidermal/dermal thinning Will trial ketaconazole 2% cream again Will consider PO steroid course in the future  --------------- ADDENDUM---------------  Initial labs fro autoimmune etiology and lupus negative May still be these things as labs may lag behind clinical presentation

## 2013-05-22 NOTE — Addendum Note (Signed)
Addended by: Konrad Dolores, Lyla Jasek J on: 05/22/2013 03:53 PM   Modules accepted: Orders

## 2013-06-19 ENCOUNTER — Encounter: Payer: Self-pay | Admitting: Family Medicine

## 2013-06-19 ENCOUNTER — Ambulatory Visit (INDEPENDENT_AMBULATORY_CARE_PROVIDER_SITE_OTHER): Payer: BC Managed Care – PPO | Admitting: Family Medicine

## 2013-06-19 VITALS — BP 138/81 | HR 108 | Temp 98.3°F | Ht 67.0 in | Wt 216.7 lb

## 2013-06-19 DIAGNOSIS — M549 Dorsalgia, unspecified: Secondary | ICD-10-CM

## 2013-06-19 MED ORDER — HYDROCODONE-ACETAMINOPHEN 5-325 MG PO TABS: 1.0000 | ORAL_TABLET | Freq: Three times a day (TID) | ORAL | Status: DC | PRN

## 2013-06-19 MED ORDER — BACLOFEN 10 MG PO TABS: 10.0000 mg | ORAL_TABLET | Freq: Three times a day (TID) | ORAL | Status: DC

## 2013-06-19 MED ORDER — DEXAMETHASONE SODIUM PHOSPHATE 10 MG/ML IJ SOLN
10.0000 mg | Freq: Once | INTRAMUSCULAR | Status: AC
  Administered 2013-06-19: 10 mg via INTRAMUSCULAR

## 2013-06-19 MED ORDER — KETOROLAC TROMETHAMINE 60 MG/2ML IM SOLN
60.0000 mg | Freq: Once | INTRAMUSCULAR | Status: AC
  Administered 2013-06-19: 60 mg via INTRAMUSCULAR

## 2013-06-19 NOTE — Progress Notes (Signed)
Subjective:    Katie Dyer is a 27 y.o. female who presents to [facility] today for back pain:  1.  Back and Right leg pain:  Started 5 days ago, worsened yesterday:   Describes as dull aching and throbbing pain in bilateral lumbar region but worse on Right side alternating with sharp stabbing pain when she moves from sitting to standing or bends over.  Has to sit on Left butt cheek due to pain. States this is typical of usual back pain she has when her back "goes out."  Pain radiates to back of hamstring on Right side but does extend past knee.  No redness or swelling.    No dysuria, hematuria, urinary frequency, radiation of pain to legs, motor weakness, decreased sensation, or headaches.  No fevers or chills.  No bladder/bowel incontinence or saddle anesthesia.    The following portions of the patient's history were reviewed and updated as appropriate: allergies, current medications, past medical history, family and social history, and problem list. Patient is a nonsmoker.    PMH reviewed.  Past Medical History  Diagnosis Date  . Asthma   . Intestinal polyps   . Depression 2003  . Insomnia 2010  . RECTAL POLYPS 07/25/2007    Qualifier: Diagnosis of  By: Constance Goltz MD, Molli Hazard    . MENSTRUATION, PAINFUL 12/06/2006    Qualifier: Diagnosis of  By: Levada Schilling    . Menorrhagia 09/19/2011   Past Surgical History  Procedure Laterality Date  . Tonsillectomy    . Cervical polypectomy    . Colonoscopy      Medications reviewed. Current Outpatient Prescriptions  Medication Sig Dispense Refill  . acetaminophen (TYLENOL) 500 MG tablet Take 500 mg by mouth every 6 (six) hours as needed. Takes for pain       . albuterol (PROVENTIL HFA;VENTOLIN HFA) 108 (90 BASE) MCG/ACT inhaler Inhale 2 puffs into the lungs every 4 (four) hours as needed. Shortness of breath  2 Inhaler  1  . cetirizine (ZYRTEC) 10 MG tablet Take 1 tablet (10 mg total) by mouth daily.  30 tablet  11  . fluticasone (FLONASE) 50  MCG/ACT nasal spray Place 2 sprays into the nose daily.  16 g  6  . Fluticasone-Salmeterol (ADVAIR) 100-50 MCG/DOSE AEPB Inhale 1 puff into the lungs 2 (two) times daily.  1 each  3  . ibuprofen (ADVIL,MOTRIN) 800 MG tablet Take 1 tablet (800 mg total) by mouth every 8 (eight) hours as needed for pain.  30 tablet  1  . ketoconazole (NIZORAL) 2 % cream Apply topically daily.  15 g  0  . traZODone (DESYREL) 50 MG tablet Take 1 tablet (50 mg total) by mouth 3 (three) times daily.  90 tablet  1  . triamcinolone cream (KENALOG) 0.1 % Apply topically 2 (two) times daily. Only treat for 2 weeks at a time  30 g  0   No current facility-administered medications for this visit.    ROS as above otherwise neg.  No chest pain, palpitations, SOB, Fever, Chills, Abd pain, N/V/D.   Objective:   Physical Exam BP 138/81  Pulse 108  Temp(Src) 98.3 F (36.8 C) (Oral)  Ht 5\' 7"  (1.702 m)  Wt 216 lb 11.2 oz (98.294 kg)  BMI 33.93 kg/m2 Gen:  Alert, cooperative patient who appears stated age in no acute distress.  Vital signs reviewed. HEENT: EOMI,  MMM Back:  Normal skin, Spine with normal alignment and no deformity.  No tenderness to vertebral  process palpation.  Paraspinous muscles are tender BL lumbar region but worse on the Right, palpable spasm noted Right lumbar.   Range of motion is full at neck and decreased forward flexion due to pain lumbar sacral regions.  Straight leg raise is positive for Right sided back pain with SLR of both legs.   Neuro:  Sensation and motor function 5/5 bilateral lower extremities.  Patellar and Achilles  DTR's +2 patellar BL Legs:  No edema or redness noted BL.  Full extension and flexion of knees without pain.     No results found for this or any previous visit (from the past 72 hour(s)).

## 2013-06-19 NOTE — Patient Instructions (Signed)
Take the Norco if you need it for pain relief every 8 hours.  Baclofen as a muscle relaxer.    Referral for physical therapy.    Come back in 2 weeks if you're still having pain, or sooner if it's worsening.  Continue with either ice or heat, whichever feels better.  It was good to see you today

## 2013-06-19 NOTE — Assessment & Plan Note (Signed)
Radiation to Right side.  Toradol plus decadron here in clinic.  Short term norco plus baclofen as no relief with Tramadol. FU in 2 weeks if no improvement, sooner if worsening. REfer back to PT.

## 2013-06-19 NOTE — Addendum Note (Signed)
Addended by: Farrell Ours on: 06/19/2013 05:23 PM   Modules accepted: Orders

## 2013-06-23 ENCOUNTER — Telehealth: Payer: Self-pay | Admitting: Family Medicine

## 2013-06-23 NOTE — Telephone Encounter (Signed)
Pt having side effect of nausea when taking both Narco and Baclofen.  Wait 5 hours in between taking each.  Also taking with food.  Still having the nausea effect.  Please call patient back asap to advise.  Patient saw Dr. Gwendolyn Grant for visit and rx on Thusday.  Inquring about PT referral as well.  Haven't heard anything on that yet.

## 2013-06-23 NOTE — Telephone Encounter (Signed)
Returned call to patient and informed of message from Dr. Gwendolyn Grant.  Patient verbalized understanding.  Gaylene Brooks, RN

## 2013-06-23 NOTE — Telephone Encounter (Signed)
Can try just Baclofen for relief.  Use Tramadol for pain if bad.  This will help her know whether it's Baclofen or Norco making her sick, likely it's the Norco.    PT will call her in about a week or so.

## 2013-06-26 ENCOUNTER — Telehealth: Payer: Self-pay | Admitting: Family Medicine

## 2013-06-26 NOTE — Telephone Encounter (Signed)
Per Dr Gwendolyn Grant patient needs to been,related message,pt will call back to schedule a follow up with Dr Gwendolyn Grant. Missie Gehrig, Virgel Bouquet

## 2013-06-26 NOTE — Telephone Encounter (Signed)
Pt was seen by Dr Gwendolyn Grant on 06-19-13. She is still in pain. The medicine isnt helping at all. Says it seems to be worse. Please advise

## 2013-06-27 ENCOUNTER — Ambulatory Visit: Payer: BC Managed Care – PPO

## 2013-07-03 ENCOUNTER — Ambulatory Visit: Payer: BC Managed Care – PPO | Attending: Family Medicine | Admitting: Physical Therapy

## 2013-07-03 DIAGNOSIS — M545 Low back pain, unspecified: Secondary | ICD-10-CM | POA: Insufficient documentation

## 2013-07-03 DIAGNOSIS — IMO0001 Reserved for inherently not codable concepts without codable children: Secondary | ICD-10-CM | POA: Insufficient documentation

## 2013-07-08 ENCOUNTER — Ambulatory Visit: Payer: BC Managed Care – PPO | Admitting: Physical Therapy

## 2013-07-10 ENCOUNTER — Ambulatory Visit: Payer: BC Managed Care – PPO | Attending: Family Medicine | Admitting: Physical Therapy

## 2013-07-10 DIAGNOSIS — IMO0001 Reserved for inherently not codable concepts without codable children: Secondary | ICD-10-CM | POA: Insufficient documentation

## 2013-07-10 DIAGNOSIS — M545 Low back pain, unspecified: Secondary | ICD-10-CM | POA: Insufficient documentation

## 2013-07-17 ENCOUNTER — Ambulatory Visit: Payer: BC Managed Care – PPO | Admitting: Physical Therapy

## 2013-07-22 ENCOUNTER — Ambulatory Visit: Payer: BC Managed Care – PPO | Admitting: Physical Therapy

## 2013-07-24 ENCOUNTER — Ambulatory Visit (INDEPENDENT_AMBULATORY_CARE_PROVIDER_SITE_OTHER): Payer: BC Managed Care – PPO | Admitting: Family Medicine

## 2013-07-24 ENCOUNTER — Ambulatory Visit (HOSPITAL_COMMUNITY)
Admission: RE | Admit: 2013-07-24 | Discharge: 2013-07-24 | Disposition: A | Discharge status: Home / Self Care | Payer: BC Managed Care – PPO | Source: Ambulatory Visit | Attending: Family Medicine | Admitting: Family Medicine

## 2013-07-24 VITALS — BP 123/81 | HR 82 | Temp 98.8°F | Ht 67.0 in | Wt 217.3 lb

## 2013-07-24 DIAGNOSIS — M545 Low back pain, unspecified: Secondary | ICD-10-CM | POA: Insufficient documentation

## 2013-07-24 DIAGNOSIS — M549 Dorsalgia, unspecified: Secondary | ICD-10-CM

## 2013-07-24 DIAGNOSIS — Z23 Encounter for immunization: Secondary | ICD-10-CM

## 2013-07-24 MED ORDER — PREDNISONE 20 MG PO TABS: 40.0000 mg | ORAL_TABLET | Freq: Every day | ORAL | Status: DC

## 2013-07-24 NOTE — Patient Instructions (Signed)
Nice to meet you. We will get an X-ray of your back to evaluate your pain. Please also take the prednisone that has been prescribed. Let us know if this helps with your pain. If you are still continuing to have pain following this treatment, please come back and see [place].  Back Pain, Adult Back pain is very common. The pain often gets better over time. The cause of back pain is usually not dangerous. Most people can learn to manage their back pain on their own.  HOME CARE   Stay active. Start with short walks on flat ground if you can. Try to walk farther each day.  Do not sit, drive, or stand in one place for more than 30 minutes. Do not stay in bed.  Do not avoid exercise or work. Activity can help your back heal faster.  Be careful when you bend or lift an object. Bend at your knees, keep the object close to you, and do not twist.  Sleep on a firm mattress. Lie on your side, and bend your knees. If you lie on your back, put a pillow under your knees.  Only take medicines as told by your doctor.  Put ice on the injured area.  Put ice in a plastic bag.  Place a towel between your skin and the bag.  Leave the ice on for 15-20 minutes, 3-4 times a day for the first 2 to 3 days. After that, you can switch between ice and heat packs.  Ask your doctor about back exercises or massage.  Avoid feeling anxious or stressed. Find good ways to deal with stress, such as exercise. GET HELP RIGHT AWAY IF:   Your pain does not go away with rest or medicine.  Your pain does not go away.  You have new problems.  You do not feel well.  The pain spreads into your legs.  You cannot control when you poop (bowel movement) or pee (urinate).  Your arms or legs feel weak or lose feeling (numbness).  You feel sick to your stomach (nauseous) or throw up (vomit).  You have belly (abdominal) pain.  You feel like you may pass out (faint). MAKE SURE YOU:   Understand these instructions.  Will  watch your condition.  Will get help right away if you are not doing well or get worse. Document Released: 03/13/2008 Document Revised: 12/18/2011 Document Reviewed: 02/13/2011 [facility] Patient Information 2014 [place], [place].

## 2013-07-28 NOTE — Progress Notes (Signed)
Patient ID: Katie Dyer, female   DOB: 11/30/1985, 27 y.o.   MRN: [id number] Katie Dyer is a 27 y.o. female who presents today for f/u of back pain with radiation to right posterior leg.  Continues to have back pain following previous evaluation. Notes was given a steroid shot and NSAIDs last time seen in the office and these didn't help. Was given Rx for flexeril and this did not help. Went to PT and this did not help. Continues to have pain in right lower back with radiation down her right buttock and right posterior leg. States is sharp and throbbing. 10/10. Denies incontinence, numbness, and weakness, and saddle anesthesia. Has tried vicodin and baclofen without benefit.    Past Medical History  Diagnosis Date  . Asthma   . Intestinal polyps   . Depression 2003  . Insomnia 2010  . RECTAL POLYPS 07/25/2007    Qualifier: Diagnosis of  By: Constance Goltz MD, Molli Hazard    . MENSTRUATION, PAINFUL 12/06/2006    Qualifier: Diagnosis of  By: Levada Schilling    . Menorrhagia 09/19/2011    History  Smoking status  . Never Smoker   Smokeless tobacco  . Never Used    Family History  Problem Relation Age of Onset  . Hypertension Other   . Anesthesia problems Neg Hx   . Bipolar disorder Mother   . Hypertension Mother   . Depression Maternal Aunt   . Hypothyroidism Maternal Aunt   . Cancer Maternal Aunt     breast cancer in multiple aunts  . Depression Maternal Uncle   . Depression Maternal Grandmother   . Hyperthyroidism Maternal Grandmother   . Sjogren's syndrome Maternal Grandmother   . Asthma Maternal Grandmother   . Diabetes Maternal Grandfather   . Hypertension Maternal Grandfather   . Cancer Maternal Grandfather     pancreatic     Current Outpatient Prescriptions on File Prior to Visit  Medication Sig Dispense Refill  . acetaminophen (TYLENOL) 500 MG tablet Take 500 mg by mouth every 6 (six) hours as needed. Takes for pain       . albuterol (PROVENTIL HFA;VENTOLIN HFA) 108  (90 BASE) MCG/ACT inhaler Inhale 2 puffs into the lungs every 4 (four) hours as needed. Shortness of breath  2 Inhaler  1  . baclofen (LIORESAL) 10 MG tablet Take 1 tablet (10 mg total) by mouth 3 (three) times daily.  30 each  0  . cetirizine (ZYRTEC) 10 MG tablet Take 1 tablet (10 mg total) by mouth daily.  30 tablet  11  . fluticasone (FLONASE) 50 MCG/ACT nasal spray Place 2 sprays into the nose daily.  16 g  6  . Fluticasone-Salmeterol (ADVAIR) 100-50 MCG/DOSE AEPB Inhale 1 puff into the lungs 2 (two) times daily.  1 each  3  . HYDROcodone-acetaminophen (NORCO/VICODIN) 5-325 MG per tablet Take 1 tablet by mouth every 8 (eight) hours as needed for pain.  25 tablet  0  . ibuprofen (ADVIL,MOTRIN) 800 MG tablet Take 1 tablet (800 mg total) by mouth every 8 (eight) hours as needed for pain.  30 tablet  1  . ketoconazole (NIZORAL) 2 % cream Apply topically daily.  15 g  0  . traZODone (DESYREL) 50 MG tablet Take 1 tablet (50 mg total) by mouth 3 (three) times daily.  90 tablet  1  . triamcinolone cream (KENALOG) 0.1 % Apply topically 2 (two) times daily. Only treat for 2 weeks at a time  30 g  0  No current facility-administered medications on file prior to visit.    ROS: Per HPI   Physical Exam Filed Vitals:   07/24/13 1601  BP: 123/81  Pulse: 82  Temp: 98.8 F (37.1 C)    Physical Examination: General appearance - alert, well appearing, and in no distress Chest - clear to auscultation, no wheezes, rales or rhonchi, symmetric air entry Heart - normal rate, regular rhythm, normal S1, S2, no murmurs, rubs, clicks or gallops Neurological - 5/5 strength in bilateral hip flexors, quads, hamstrings, plantar and dorsiflexion, sensation to light touch intact in bilateral LE, positive straight leg raise on the right Musculoskeletal - tenderness to palpation in right lower back and upper buttock, no midline tenderness to palpation Extremities - no pedal edema noted   Assessment/Plan: Please  see individual problem list.

## 2013-07-28 NOTE — Assessment & Plan Note (Addendum)
Patient with continued back pain that has not improved with PT, muscle relaxer, or narcotic. Given positive straight leg raise concern would be for herniated disc. Will start with XR of her lumbar spine and will treat with a course prednisone and ibuprofen. Patient to follow-up if having continued pain. Given red flags for return to care in [facility].

## 2013-07-29 ENCOUNTER — Encounter: Payer: Self-pay | Admitting: *Deleted

## 2013-07-30 ENCOUNTER — Telehealth: Payer: Self-pay | Admitting: Family Medicine

## 2013-07-30 NOTE — Telephone Encounter (Signed)
Will forward to MD that pt saw. Emillio Ngo,CMA

## 2013-07-30 NOTE — Telephone Encounter (Signed)
Spoke with patient regarding results of mild loss of L4-L5 disc height. I would not expect this small of a change in the height of the disc to cause the kind of pain she is having. Advised that if she continues to have pain she should call for an appointment with her PCP.

## 2013-07-30 NOTE — Telephone Encounter (Signed)
Pt called and would like someone to call her with her test results from last week. JW

## 2013-08-08 ENCOUNTER — Ambulatory Visit (INDEPENDENT_AMBULATORY_CARE_PROVIDER_SITE_OTHER): Payer: BC Managed Care – PPO | Admitting: Family Medicine

## 2013-08-08 ENCOUNTER — Encounter: Payer: Self-pay | Admitting: Family Medicine

## 2013-08-08 VITALS — BP 150/84 | HR 108 | Temp 98.4°F | Ht 67.0 in | Wt 218.0 lb

## 2013-08-08 DIAGNOSIS — M549 Dorsalgia, unspecified: Secondary | ICD-10-CM

## 2013-08-08 MED ORDER — DICLOFENAC SODIUM 1 % TD GEL: 2.0000 g | Freq: Four times a day (QID) | TRANSDERMAL | Status: DC

## 2013-08-08 NOTE — Patient Instructions (Signed)
Yiou are doing well overall Please continue with the ibuprofen and baclofen Continue with your exercises.  Please also try the voltaren gel for your back Go have the MRI done and follow up with me in 2 weeks.

## 2013-08-08 NOTE — Addendum Note (Signed)
Addended by: Konrad Dolores, DAVID J on: 08/08/2013 10:44 AM   Modules accepted: Orders

## 2013-08-08 NOTE — Progress Notes (Signed)
Katie Dyer is a 27 y.o. female who presents to [facility] today for persistent back pain  Back pain: seen in office since September. Xr w/ L4-5 mild compression. Tried on baclofen, NSAID adn pred course. No benefit. Nothing has helped up to this point. Constant. PT w/o benefit. Still w/ lumbar pain w/ radiation down R posterior thigh. Denies any loss ob bowel or bladder function or falls. Denies falls or trauma.   HTN: hypertensive today but not previously in pain today. No change at this tme   The following portions of the patient's history were reviewed and updated as appropriate: allergies, current medications, past medical history, family and social history, and problem list.  Patient is a nonsmoker.  Past Medical History  Diagnosis Date  . Asthma   . Intestinal polyps   . Depression 2003  . Insomnia 2010  . RECTAL POLYPS 07/25/2007    Qualifier: Diagnosis of  By: Constance Goltz MD, Molli Hazard    . MENSTRUATION, PAINFUL 12/06/2006    Qualifier: Diagnosis of  By: Levada Schilling    . Menorrhagia 09/19/2011    ROS as above otherwise neg.    Medications reviewed. Current Outpatient Prescriptions  Medication Sig Dispense Refill  . acetaminophen (TYLENOL) 500 MG tablet Take 500 mg by mouth every 6 (six) hours as needed. Takes for pain       . albuterol (PROVENTIL HFA;VENTOLIN HFA) 108 (90 BASE) MCG/ACT inhaler Inhale 2 puffs into the lungs every 4 (four) hours as needed. Shortness of breath  2 Inhaler  1  . baclofen (LIORESAL) 10 MG tablet Take 1 tablet (10 mg total) by mouth 3 (three) times daily.  30 each  0  . cetirizine (ZYRTEC) 10 MG tablet Take 1 tablet (10 mg total) by mouth daily.  30 tablet  11  . fluticasone (FLONASE) 50 MCG/ACT nasal spray Place 2 sprays into the nose daily.  16 g  6  . Fluticasone-Salmeterol (ADVAIR) 100-50 MCG/DOSE AEPB Inhale 1 puff into the lungs 2 (two) times daily.  1 each  3  . HYDROcodone-acetaminophen (NORCO/VICODIN) 5-325 MG per tablet Take 1 tablet by mouth every  8 (eight) hours as needed for pain.  25 tablet  0  . ibuprofen (ADVIL,MOTRIN) 800 MG tablet Take 1 tablet (800 mg total) by mouth every 8 (eight) hours as needed for pain.  30 tablet  1  . ketoconazole (NIZORAL) 2 % cream Apply topically daily.  15 g  0  . predniSONE (DELTASONE) 20 MG tablet Take 2 tablets (40 mg total) by mouth daily.  14 tablet  0  . traZODone (DESYREL) 50 MG tablet Take 1 tablet (50 mg total) by mouth 3 (three) times daily.  90 tablet  1  . triamcinolone cream (KENALOG) 0.1 % Apply topically 2 (two) times daily. Only treat for 2 weeks at a time  30 g  0   No current facility-administered medications for this visit.    Exam:  BP 150/84  Pulse 108  Temp(Src) 98.4 F (36.9 C) (Oral)  Ht 5\' 7"  (1.702 m)  Wt 218 lb (98.884 kg)  BMI 34.14 kg/m2  LMP 07/08/2013 Gen: Well NAD HEENT: EOMI,  MMM Lungs: CTABL Nl WOB Heart: RRR no MRG Abd: NABS, NT, ND MSK: perispinal lumbar pain on palpation and pain over the SI joint. FABERs positive on R.  Exts: Non edematous BL  LE, warm and well perfused.   No results found for this or any previous visit (from the past 72 hour(s)).

## 2013-08-08 NOTE — Assessment & Plan Note (Addendum)
Sciatica/SI joint type pain. Persistent despite treatment w/ Baclofen, NSAIDs Steroids, PT, and rest.  Xray w. Slight compression of L4-5 MRI Start voltaren gel Further exercises discussed.  May need spinal inj.

## 2013-08-17 ENCOUNTER — Ambulatory Visit
Admission: RE | Admit: 2013-08-17 | Discharge: 2013-08-17 | Disposition: A | Discharge status: Home / Self Care | Payer: BC Managed Care – PPO | Source: Ambulatory Visit | Attending: Family Medicine | Admitting: Family Medicine

## 2013-08-17 DIAGNOSIS — M549 Dorsalgia, unspecified: Secondary | ICD-10-CM

## 2013-08-17 MED ORDER — GADOBENATE DIMEGLUMINE 529 MG/ML IV SOLN
20.0000 mL | Freq: Once | INTRAVENOUS | Status: AC | PRN
  Administered 2013-08-17: 20 mL via INTRAVENOUS

## 2013-08-20 ENCOUNTER — Telehealth: Payer: Self-pay

## 2013-08-20 DIAGNOSIS — M549 Dorsalgia, unspecified: Secondary | ICD-10-CM

## 2013-08-20 NOTE — Telephone Encounter (Signed)
Patient requesting MRI results. Please call patient.  °

## 2013-08-25 ENCOUNTER — Encounter: Payer: Self-pay | Admitting: Family Medicine

## 2013-08-25 NOTE — Telephone Encounter (Signed)
Called and left a voicemail for pt to call the clinic to get results from MRI Mild lumbar disease.  Would favor conservative mgt (wait and watch at this time along w/ current therapy), but would be OK w/ ortho referral as pt very adamant about this.   Shelly Flatten, MD Family Medicine PGY-3 08/25/2013, 8:45 AM

## 2013-08-25 NOTE — Assessment & Plan Note (Signed)
No change Reviewed MRI w/ pt  Pain persists Despite reisurance that pain likely to resolve w/ conservative therapy pt adamant about seeing ortho Will place referral

## 2013-08-25 NOTE — Telephone Encounter (Signed)
Patient returns calls. Please call patient back.

## 2013-08-27 ENCOUNTER — Encounter: Payer: Self-pay | Admitting: Family Medicine

## 2013-10-28 ENCOUNTER — Ambulatory Visit (INDEPENDENT_AMBULATORY_CARE_PROVIDER_SITE_OTHER): Payer: BC Managed Care – PPO | Admitting: Family Medicine

## 2013-10-28 ENCOUNTER — Telehealth: Payer: Self-pay | Admitting: Family Medicine

## 2013-10-28 VITALS — BP 122/81 | HR 87 | Temp 98.4°F | Ht 67.0 in | Wt 219.0 lb

## 2013-10-28 DIAGNOSIS — J029 Acute pharyngitis, unspecified: Secondary | ICD-10-CM

## 2013-10-28 LAB — POCT RAPID STREP A (OFFICE): RAPID STREP A SCREEN: NEGATIVE

## 2013-10-28 MED ORDER — HYDROCODONE-HOMATROPINE 5-1.5 MG/5ML PO SYRP: 5.0000 mL | ORAL_SOLUTION | Freq: Four times a day (QID) | ORAL | Status: DC | PRN

## 2013-10-28 NOTE — Telephone Encounter (Signed)
Called pt. Informed. .Valmore Arabie  

## 2013-10-28 NOTE — Assessment & Plan Note (Signed)
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 Generally nontoxic and no evidence for acute asthma flare. Rx for Hycodan and magic mouthwash q6h PRN for cough and sore throat pain. Advised supportive care otherwise. Follow-up PRN.

## 2013-10-28 NOTE — Telephone Encounter (Signed)
Saw dr street this morning. She wants to know how long she needs to stay out of work

## 2013-10-28 NOTE — Progress Notes (Signed)
   Subjective:    Patient ID: Katie Dyer, female    DOB: April 13, 1986, 28 y.o.   MRN: [id number]  HPI: Pt presents to clinic for SDA for sore throat for 4 days. 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 Pt started with sore throat on Saturday (3 days ago), and has had cough, congestion, pain swallowing and talking but has not been inable to swallow or handle secretions; pt has mild SOB intermittently but has a diagnosis of asthma (does not have an inhaler). Pt has not had fevers, nausea, or vomiting. She has taken Mucinex and Nyquil without any relief.   Review of Systems: As above.     Objective:   Physical Exam BP 122/81  Pulse 87  Temp(Src) 98.4 F (36.9 C) (Oral)  Ht 5\' 7"  (1.702 m)  Wt 219 lb (99.338 kg)  BMI 34.29 kg/m2  SpO2 98% Gen: uncomfortable but non-toxic-appearing adult female in NAD HEENT: [place]/AT, TM's clear bilaterally, MMM, posterior oropharynx and nasal mucosae very red and edematous  No anterior cervical lymphadenopathy but some soft tissue fullness/tenderness of anterior neck  Strep throat swab negative Pulm: CTAB, no wheezes, normal WOB Cardio: RRR, no murmur appreciated Abd: soft, nontender, BS+ Ext: warm, well-perfused, no LE edema     Assessment & Plan:

## 2013-10-28 NOTE — Patient Instructions (Addendum)
Thank you for coming in, today!  You most likely have a virus causing your symptoms. Your strep test was negative. I do not think you need an antibiotic at this point. Make sure drink plenty of fluids. I will prescribe two medicines to take by mouth. One is a "magic mouthwash" that will help with throat pain. One is a cough medicine that has hydrocodone in it, and may make you very sleepy. Do NOT take these medicines and then drive afterward.  If your symptoms are worse instead of better in the next 5-7 days, or if you develop new symptoms, call the clinic or come back to be checked out again. Please feel free to call with any questions or concerns at any time, at [phone]. --Dr. Casper HarrisonStreet

## 2013-10-28 NOTE — Telephone Encounter (Signed)
Pt should stay out of work until she is at least 24 hours without fever. Thanks! --CMS

## 2013-10-30 ENCOUNTER — Encounter (HOSPITAL_COMMUNITY): Payer: Self-pay | Admitting: Emergency Medicine

## 2013-10-30 ENCOUNTER — Emergency Department (INDEPENDENT_AMBULATORY_CARE_PROVIDER_SITE_OTHER): Payer: BC Managed Care – PPO

## 2013-10-30 ENCOUNTER — Emergency Department (INDEPENDENT_AMBULATORY_CARE_PROVIDER_SITE_OTHER)
Admission: EM | Admit: 2013-10-30 | Discharge: 2013-10-30 | Disposition: A | Discharge status: Home / Self Care | Payer: BC Managed Care – PPO | Source: Home / Self Care | Attending: Emergency Medicine | Admitting: Emergency Medicine

## 2013-10-30 DIAGNOSIS — J069 Acute upper respiratory infection, unspecified: Secondary | ICD-10-CM

## 2013-10-30 DIAGNOSIS — B9789 Other viral agents as the cause of diseases classified elsewhere: Principal | ICD-10-CM

## 2013-10-30 DIAGNOSIS — J45901 Unspecified asthma with (acute) exacerbation: Secondary | ICD-10-CM

## 2013-10-30 MED ORDER — PREDNISONE 50 MG PO TABS: 50.0000 mg | ORAL_TABLET | Freq: Every day | ORAL | Status: DC

## 2013-10-30 MED ORDER — PREDNISONE 20 MG PO TABS
60.0000 mg | ORAL_TABLET | Freq: Once | ORAL | Status: AC
  Administered 2013-10-30: 60 mg via ORAL

## 2013-10-30 MED ORDER — IPRATROPIUM-ALBUTEROL 0.5-2.5 (3) MG/3ML IN SOLN
3.0000 mL | Freq: Once | RESPIRATORY_TRACT | Status: AC
  Administered 2013-10-30: 3 mL via RESPIRATORY_TRACT

## 2013-10-30 MED ORDER — ALBUTEROL SULFATE HFA 108 (90 BASE) MCG/ACT IN AERS
INHALATION_SPRAY | RESPIRATORY_TRACT | Status: AC
  Filled 2013-10-30: qty 6.7

## 2013-10-30 MED ORDER — IPRATROPIUM-ALBUTEROL 0.5-2.5 (3) MG/3ML IN SOLN
RESPIRATORY_TRACT | Status: AC
  Filled 2013-10-30: qty 3

## 2013-10-30 MED ORDER — ALBUTEROL SULFATE HFA 108 (90 BASE) MCG/ACT IN AERS
2.0000 | INHALATION_SPRAY | RESPIRATORY_TRACT | Status: DC | PRN
  Administered 2013-10-30: 2 via RESPIRATORY_TRACT

## 2013-10-30 MED ORDER — ALBUTEROL SULFATE HFA 108 (90 BASE) MCG/ACT IN AERS: 1.0000 | INHALATION_SPRAY | Freq: Four times a day (QID) | RESPIRATORY_TRACT | Status: DC | PRN

## 2013-10-30 MED ORDER — PREDNISONE 20 MG PO TABS
ORAL_TABLET | ORAL | Status: AC
  Filled 2013-10-30: qty 3

## 2013-10-30 NOTE — ED Notes (Signed)
Went to get patient for CXR, patient was receiving breathing treatment 

## 2013-10-30 NOTE — Discharge Instructions (Signed)
Asthma, Acute Bronchospasm °Acute bronchospasm caused by asthma is also referred to as an asthma attack. Bronchospasm means your air passages become narrowed. The narrowing is caused by inflammation and tightening of the muscles in the air tubes (bronchi) in your lungs. This can make it hard to breath or cause you to wheeze and cough. °CAUSES °Possible triggers are: °· Animal dander from the skin, hair, or feathers of animals. °· Dust mites contained in house dust. °· Cockroaches. °· Pollen from trees or grass. °· Mold. °· Cigarette or tobacco smoke. °· Air pollutants such as dust, household cleaners, hair sprays, aerosol sprays, paint fumes, strong chemicals, or strong odors. °· Cold air or weather changes. Cold air may trigger inflammation. Winds increase molds and pollens in the air. °· Strong emotions such as crying or laughing hard. °· Stress. °· Certain medicines such as aspirin or beta-blockers. °· Sulfites in foods and drinks, such as dried fruits and wine. °· Infections or inflammatory conditions, such as a flu, cold, or inflammation of the nasal membranes (rhinitis). °· Gastroesophageal reflux disease (GERD). GERD is a condition where stomach acid backs up into your throat (esophagus). °· Exercise or strenuous activity. °SIGNS AND SYMPTOMS  °· Wheezing. °· Excessive coughing, particularly at night. °· Chest tightness. °· Shortness of breath. °DIAGNOSIS  °Your health care provider will ask you about your medical history and perform a physical exam. A chest X-ray or blood testing may be performed to look for other causes of your symptoms or other conditions that may have triggered your asthma attack.  °TREATMENT  °Treatment is aimed at reducing inflammation and opening up the airways in your lungs.  Most asthma attacks are treated with inhaled medicines. These include quick relief or rescue medicines (such as bronchodilators) and controller medicines (such as inhaled corticosteroids). These medicines are  sometimes given through an inhaler or a nebulizer. Systemic steroid medicine taken by mouth or given through an IV tube also can be used to reduce the inflammation when an attack is moderate or severe. Antibiotic medicines are only used if a bacterial infection is present.  °HOME CARE INSTRUCTIONS  °· Rest. °· Drink plenty of liquids. This helps the mucus to remain thin and be easily coughed up. Only use caffeine in moderation and do not use alcohol until you have recovered from your illness. °· Do not smoke. Avoid being exposed to secondhand smoke. °· You play a critical role in keeping yourself in good health. Avoid exposure to things that cause you to wheeze or to have breathing problems. °· Keep your medicines up to date and available. Carefully follow your health care provider's treatment plan. °· Take your medicine exactly as prescribed. °· When pollen or pollution is bad, keep windows closed and use an air conditioner or go to places with air conditioning. °· Asthma requires careful medical care. See your health care provider for a follow-up as advised. If you are more than [redacted] weeks pregnant and you were prescribed any new medicines, let your obstetrician know about the visit and how you are doing. Follow-up with your health care provider as directed. °· After you have recovered from your asthma attack, make an appointment with your outpatient doctor to talk about ways to reduce the likelihood of future attacks. If you do not have a doctor who manages your asthma, make an appointment with a primary care doctor to discuss your asthma. °SEEK IMMEDIATE MEDICAL CARE IF:  °· You are getting worse. °· You have trouble breathing. If severe, call   your local emergency services (911 in the [place] °· You develop chest pain or discomfort. °· You are vomiting. °· You are not able to keep fluids down. °· You are coughing up yellow, green, brown, or bloody sputum. °· You have a fever and your symptoms suddenly get  worse. °· You have trouble swallowing. °MAKE SURE YOU:  °· Understand these instructions. °· Will watch your condition. °· Will get help right away if you are not doing well or get worse. °Document Released: 01/10/2007 Document Revised: 05/28/2013 Document Reviewed: 04/02/2013 °[employer]® Patient Information ©2014 [employer]. ° °

## 2013-10-30 NOTE — ED Notes (Signed)
Pt c/o asthma flare up onset 2 days Sxs include: cough, chest tightness, SOB, congestion Denies: f/v/n/d She is alert w/no signs of acute distress.

## 2013-10-30 NOTE — ED Provider Notes (Signed)
CSN: [id number]     Arrival date & time 10/30/13  0830 History   First MD Initiated Contact with Patient 10/30/13 [phone]     Chief Complaint  Patient presents with  . Asthma   (Consider location/radiation/quality/duration/timing/severity/associated sxs/prior Treatment) HPI Comments: 28 year old female presents complaining of chest tightness and shortness of breath. She has a history of asthma and believes that this is due to a flare of her asthma. She is currently diagnosed with a viral upper respiratory infection as well. She was treated with cough syrup. She does not have an inhaler at home. She has chest tightness that is worse with coughing, and she feels like she cannot speak without being overcome with the urge to cough along with some burning and tightness in her chest. She has sick contacts with viral URIs. She is taking the cough medicine but it is not helping.   Past Medical History  Diagnosis Date  . Asthma   . Intestinal polyps   . Depression 2003  . Insomnia 2010  . RECTAL POLYPS 07/25/2007    Qualifier: Diagnosis of  By: Constance Goltz MD, Molli Hazard    . MENSTRUATION, PAINFUL 12/06/2006    Qualifier: Diagnosis of  By: Levada Schilling    . Menorrhagia 09/19/2011   Past Surgical History  Procedure Laterality Date  . Tonsillectomy    . Cervical polypectomy    . Colonoscopy     Family History  Problem Relation Age of Onset  . Hypertension Other   . Anesthesia problems Neg Hx   . Bipolar disorder Mother   . Hypertension Mother   . Depression Maternal Aunt   . Hypothyroidism Maternal Aunt   . Cancer Maternal Aunt     breast cancer in multiple aunts  . Depression Maternal Uncle   . Depression Maternal Grandmother   . Hyperthyroidism Maternal Grandmother   . Sjogren's syndrome Maternal Grandmother   . Asthma Maternal Grandmother   . Diabetes Maternal Grandfather   . Hypertension Maternal Grandfather   . Cancer Maternal Grandfather     pancreatic    History  Substance Use Topics   . Smoking status: Never Smoker   . Smokeless tobacco: Never Used  . Alcohol Use: Yes     Comment: ocassionally   OB History   Grav Para Term Preterm Abortions TAB SAB Ect Mult Living   0 0 0 0 0 0 0 0 0 0      Review of Systems  Constitutional: Negative for fever and chills.  HENT: Positive for congestion and rhinorrhea.   Eyes: Negative for visual disturbance.  Respiratory: Positive for cough, chest tightness and shortness of breath.   Cardiovascular: Negative for chest pain, palpitations and leg swelling.  Gastrointestinal: Negative for nausea, vomiting and abdominal pain.  Endocrine: Negative for polydipsia and polyuria.  Genitourinary: Negative for dysuria, urgency and frequency.  Musculoskeletal: Negative for arthralgias and myalgias.  Skin: Negative for rash.  Neurological: Negative for dizziness, weakness and light-headedness.    Allergies  Review of patient's allergies indicates no known allergies.  Home Medications   Current Outpatient Rx  Name  Route  Sig  Dispense  Refill  . HYDROcodone-acetaminophen (NORCO/VICODIN) 5-325 MG per tablet   Oral   Take 1 tablet by mouth every 8 (eight) hours as needed for pain.   25 tablet   0   . acetaminophen (TYLENOL) 500 MG tablet   Oral   Take 500 mg by mouth every 6 (six) hours as needed. Takes for pain          .  albuterol (PROVENTIL HFA;VENTOLIN HFA) 108 (90 BASE) MCG/ACT inhaler   Inhalation   Inhale 2 puffs into the lungs every 4 (four) hours as needed. Shortness of breath   2 Inhaler   1   . albuterol (PROVENTIL HFA;VENTOLIN HFA) 108 (90 BASE) MCG/ACT inhaler   Inhalation   Inhale 1-2 puffs into the lungs every 6 (six) hours as needed for wheezing or shortness of breath.   1 Inhaler   0   . baclofen (LIORESAL) 10 MG tablet   Oral   Take 1 tablet (10 mg total) by mouth 3 (three) times daily.   30 each   0   . carbamazepine (TEGRETOL) 200 MG tablet   Oral   Take 400 mg by mouth at bedtime.         .  cetirizine (ZYRTEC) 10 MG tablet   Oral   Take 1 tablet (10 mg total) by mouth daily.   30 tablet   11   . diclofenac sodium (VOLTAREN) 1 % GEL   Topical   Apply 2 g topically 4 (four) times daily.   100 g   1   . fluticasone (FLONASE) 50 MCG/ACT nasal spray   Nasal   Place 2 sprays into the nose daily.   16 g   6   . Fluticasone-Salmeterol (ADVAIR) 100-50 MCG/DOSE AEPB   Inhalation   Inhale 1 puff into the lungs 2 (two) times daily.   1 each   3   . HYDROcodone-homatropine (HYCODAN) 5-1.5 MG/5ML syrup   Oral   Take 5 mLs by mouth every 6 (six) hours as needed for cough.   120 mL   0   . ibuprofen (ADVIL,MOTRIN) 800 MG tablet   Oral   Take 1 tablet (800 mg total) by mouth every 8 (eight) hours as needed for pain.   30 tablet   1   . ketoconazole (NIZORAL) 2 % cream   Topical   Apply topically daily.   15 g   0   . predniSONE (DELTASONE) 50 MG tablet   Oral   Take 1 tablet (50 mg total) by mouth daily with breakfast.   2 tablet   0   . traZODone (DESYREL) 50 MG tablet   Oral   Take 1 tablet (50 mg total) by mouth 3 (three) times daily.   90 tablet   1   . triamcinolone cream (KENALOG) 0.1 %   Topical   Apply topically 2 (two) times daily. Only treat for 2 weeks at a time   30 g   0    BP 133/89  Pulse 87  Temp(Src) 98 F (36.7 C) (Oral)  Resp 18  SpO2 99%  LMP 10/16/2013 Physical Exam  Nursing note and vitals reviewed. Constitutional: She is oriented to person, place, and time. Vital signs are normal. She appears well-developed and well-nourished. No distress.  HENT:  Head: Normocephalic and atraumatic.  Mouth/Throat: Oropharynx is clear and moist. No oropharyngeal exudate.  Neck: Normal range of motion. Neck supple.  Cardiovascular: Normal rate, regular rhythm and normal heart sounds.  Exam reveals no gallop and no friction rub.   No murmur heard. Pulmonary/Chest: Effort normal. No respiratory distress. She has wheezes (minimal faint  scattered wheezes ).  Lymphadenopathy:    She has no cervical adenopathy.  Neurological: She is alert and oriented to person, place, and time. She has normal strength. Coordination normal.  Skin: Skin is warm and dry. No rash noted. She is not diaphoretic.  Psychiatric: She has a normal mood and affect. Judgment normal.    ED Course  Procedures (including critical care time) Labs Review Labs Reviewed - No data to display Imaging Review Dg Chest 2 View  10/30/2013   CLINICAL DATA:  Asthma  EXAM: CHEST  2 VIEW  COMPARISON:  October 10, 2007  FINDINGS: Lungs are clear. Heart size pulmonary vascularity are normal. No adenopathy. No bone lesions.  IMPRESSION: No abnormality noted.   Electronically Signed   By: Bretta Bang M.D.   On: 10/30/2013 09:59      MDM   1. Viral URI with cough   2. Asthma exacerbation    Chest x-ray is normal. Symptomatic improvement with DuoNeb. Treating for asthma exacerbation. Followup when necessary, ED if worsening.   Meds ordered this encounter  Medications  . ipratropium-albuterol (DUONEB) 0.5-2.5 (3) MG/3ML nebulizer solution 3 mL    Sig:   . predniSONE (DELTASONE) tablet 60 mg    Sig:   . albuterol (PROVENTIL HFA;VENTOLIN HFA) 108 (90 BASE) MCG/ACT inhaler 2 puff    Sig:   . predniSONE (DELTASONE) 50 MG tablet    Sig: Take 1 tablet (50 mg total) by mouth daily with breakfast.    Dispense:  2 tablet    Refill:  0    Order Specific Question:  Supervising Provider    Answer:  Lorenz Coaster, DAVID C [id number]  . albuterol (PROVENTIL HFA;VENTOLIN HFA) 108 (90 BASE) MCG/ACT inhaler    Sig: Inhale 1-2 puffs into the lungs every 6 (six) hours as needed for wheezing or shortness of breath.    Dispense:  1 Inhaler    Refill:  0    Order Specific Question:  Supervising Provider    Answer:  Lorenz Coaster, DAVID C [[id number]]       Graylon Good, PA-C 10/30/13 1004

## 2013-10-30 NOTE — ED Provider Notes (Signed)
Medical screening examination/treatment/procedure(s) were performed by non-physician practitioner and as supervising physician I was immediately available for consultation/collaboration.  Leslee Homeavid Jayana Kotula, M.D.   Reuben Likesavid C Nemesis Rainwater, MD 10/30/13 [phone]

## 2014-03-21 ENCOUNTER — Emergency Department (INDEPENDENT_AMBULATORY_CARE_PROVIDER_SITE_OTHER)
Admission: EM | Admit: 2014-03-21 | Discharge: 2014-03-21 | Disposition: A | Discharge status: Home / Self Care | Payer: BC Managed Care – PPO | Source: Home / Self Care | Attending: Emergency Medicine | Admitting: Emergency Medicine

## 2014-03-21 ENCOUNTER — Encounter (HOSPITAL_COMMUNITY): Payer: Self-pay | Admitting: Emergency Medicine

## 2014-03-21 DIAGNOSIS — M549 Dorsalgia, unspecified: Secondary | ICD-10-CM

## 2014-03-21 DIAGNOSIS — M62838 Other muscle spasm: Secondary | ICD-10-CM

## 2014-03-21 DIAGNOSIS — M542 Cervicalgia: Secondary | ICD-10-CM

## 2014-03-21 MED ORDER — METHOCARBAMOL 500 MG PO TABS: 500.0000 mg | ORAL_TABLET | Freq: Four times a day (QID) | ORAL | Status: DC | PRN

## 2014-03-21 MED ORDER — HYDROCODONE-ACETAMINOPHEN 7.5-325 MG PO TABS: 1.0000 | ORAL_TABLET | Freq: Four times a day (QID) | ORAL | Status: DC | PRN

## 2014-03-21 MED ORDER — MELOXICAM 15 MG PO TABS: 15.0000 mg | ORAL_TABLET | Freq: Every day | ORAL | Status: DC

## 2014-03-21 NOTE — ED Notes (Signed)
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 Alert w/no signs of acute distress.

## 2014-03-21 NOTE — ED Provider Notes (Signed)
Medical screening examination/treatment/procedure(s) were performed by non-physician practitioner and as supervising physician I was immediately available for consultation/collaboration.  Harbour Nordmeyer, M.D.  Shawnee Gambone C Rheta Hemmelgarn, MD 03/21/14 2220 

## 2014-03-21 NOTE — Discharge Instructions (Signed)
Back Pain, Adult Low back pain is very common. About 1 in 5 people have back pain.The cause of low back pain is rarely dangerous. The pain often gets better over time.About half of people with a sudden onset of back pain feel better in just 2 weeks. About 8 in 10 people feel better by 6 weeks.  CAUSES Some common causes of back pain include:  Strain of the muscles or ligaments supporting the spine.  Wear and tear (degeneration) of the spinal discs.  Arthritis.  Direct injury to the back. DIAGNOSIS Most of the time, the direct cause of low back pain is not known.However, back pain can be treated effectively even when the exact cause of the pain is unknown.Answering your caregiver's questions about your overall health and symptoms is one of the most accurate ways to make sure the cause of your pain is not dangerous. If your caregiver needs more information, he or she may order lab work or imaging tests (X-rays or MRIs).However, even if imaging tests show changes in your back, this usually does not require surgery. HOME CARE INSTRUCTIONS For many people, back pain returns.Since low back pain is rarely dangerous, it is often a condition that people can learn to [facility] their own.   Remain active. It is stressful on the back to sit or stand in one place. Do not sit, drive, or stand in one place for more than 30 minutes at a time. Take short walks on level surfaces as soon as pain allows.Try to increase the length of time you walk each day.  Do not stay in bed.Resting more than 1 or 2 days can delay your recovery.  Do not avoid exercise or work.Your body is made to move.It is not dangerous to be active, even though your back may hurt.Your back will likely heal faster if you return to being active before your pain is gone.  Pay attention to your body when you bend and lift. Many people have less discomfortwhen lifting if they bend their knees, keep the load close to their bodies,and  avoid twisting. Often, the most comfortable positions are those that put less stress on your recovering back.  Find a comfortable position to sleep. Use a firm mattress and lie on your side with your knees slightly bent. If you lie on your back, put a pillow under your knees.  Only take over-the-counter or prescription medicines as directed by your caregiver. Over-the-counter medicines to reduce pain and inflammation are often the most helpful.Your caregiver may prescribe muscle relaxant drugs.These medicines help dull your pain so you can more quickly return to your normal activities and healthy exercise.  Put ice on the injured area.  Put ice in a plastic bag.  Place a towel between your skin and the bag.  Leave the ice on for 15-20 minutes, 03-04 times a day for the first 2 to 3 days. After that, ice and heat may be alternated to reduce pain and spasms.  Ask your caregiver about trying back exercises and gentle massage. This may be of some benefit.  Avoid feeling anxious or stressed.Stress increases muscle tension and can worsen back pain.It is important to recognize when you are anxious or stressed and learn ways to manage it.Exercise is a great option. SEEK MEDICAL CARE IF:  You have pain that is not relieved with rest or medicine.  You have pain that does not improve in 1 week.  You have new symptoms.  You are generally not feeling well. SEEK  IMMEDIATE MEDICAL CARE IF:   You have pain that radiates from your back into your legs.  You develop new bowel or bladder control problems.  You have unusual weakness or numbness in your arms or legs.  You develop nausea or vomiting.  You develop abdominal pain.  You feel faint. Document Released: 09/25/2005 Document Revised: 03/26/2012 Document Reviewed: 02/13/2011 [facility] Patient Information 2014 [employer], [place].  Muscle Cramps and Spasms Muscle cramps and spasms occur when a muscle or muscles tighten and you have no  control over this tightening (involuntary muscle contraction). They are a common problem and can develop in any muscle. The most common place is in the calf muscles of the leg. Both muscle cramps and muscle spasms are involuntary muscle contractions, but they also have differences:   Muscle cramps are sporadic and painful. They may last a few seconds to a quarter of an hour. Muscle cramps are often more forceful and last longer than muscle spasms.  Muscle spasms may or may not be painful. They may also last just a few seconds or much longer. CAUSES  It is uncommon for cramps or spasms to be due to a serious underlying problem. In many cases, the cause of cramps or spasms is unknown. Some common causes are:   Overexertion.   Overuse from repetitive motions (doing the same thing over and over).   Remaining in a certain position for a long period of time.   Improper preparation, form, or technique while performing a sport or activity.   Dehydration.   Injury.   Side effects of some medicines.   Abnormally low levels of the salts and ions in your blood (electrolytes), especially potassium and calcium. This could happen if you are taking water pills (diuretics) or you are pregnant.  Some underlying medical problems can make it more likely to develop cramps or spasms. These include, but are not limited to:   Diabetes.   Parkinson disease.   Hormone disorders, such as thyroid problems.   Alcohol abuse.   Diseases specific to muscles, joints, and bones.   Blood vessel disease where not enough blood is getting to the muscles.  HOME CARE INSTRUCTIONS   Stay well hydrated. Drink enough water and fluids to keep your urine clear or pale yellow.  It may be helpful to massage, stretch, and relax the affected muscle.  For tight or tense muscles, use a warm towel, heating pad, or hot shower water directed to the affected area.  If you are sore or have pain after a cramp or  spasm, applying ice to the affected area may relieve discomfort.  Put ice in a plastic bag.  Place a towel between your skin and the bag.  Leave the ice on for 15-20 minutes, 03-04 times a day.  Medicines used to treat a known cause of cramps or spasms may help reduce their frequency or severity. Only take over-the-counter or prescription medicines as directed by your caregiver. SEEK MEDICAL CARE IF:  Your cramps or spasms get more severe, more frequent, or do not improve over time.  MAKE SURE YOU:   Understand these instructions.  Will watch your condition.  Will get help right away if you are not doing well or get worse. Document Released: 03/17/2002 Document Revised: 01/20/2013 Document Reviewed: 09/11/2012 [facility] Patient Information 2014 [place], [place].

## 2014-03-21 NOTE — ED Provider Notes (Signed)
CSN: [id number]     Arrival date & time 03/21/14  1723 History   First MD Initiated Contact with Patient 03/21/14 1752     Chief Complaint  Patient presents with  . Shoulder Pain   (Consider location/radiation/quality/duration/timing/severity/associated sxs/prior Treatment) HPI Comments: 28 year old female presents complaining of shoulder and back pain for 4 days. She says the pain is worse with any movement and is worse with turning her head so she cannot turn her head right now, she just has to turn her body to look to the side. This started on Wednesday evening. She denies any specific injury, she just started having pain. The pain increases with activity. She has tried taking Advil, Tylenol, and Norco for this without any relief of her pain. She denies any numbness or weakness in her extremities, chest pain, shortness of breath, NVD, headache.  Patient is a 28 y.o. female presenting with shoulder pain.  Shoulder Pain    Past Medical History  Diagnosis Date  . Asthma   . Intestinal polyps   . Depression 2003  . Insomnia 2010  . RECTAL POLYPS 07/25/2007    Qualifier: Diagnosis of  By: Constance Goltz MD, Molli Hazard    . MENSTRUATION, PAINFUL 12/06/2006    Qualifier: Diagnosis of  By: Levada Schilling    . Menorrhagia 09/19/2011   Past Surgical History  Procedure Laterality Date  . Tonsillectomy    . Cervical polypectomy    . Colonoscopy     Family History  Problem Relation Age of Onset  . Hypertension Other   . Anesthesia problems Neg Hx   . Bipolar disorder Mother   . Hypertension Mother   . Depression Maternal Aunt   . Hypothyroidism Maternal Aunt   . Cancer Maternal Aunt     breast cancer in multiple aunts  . Depression Maternal Uncle   . Depression Maternal Grandmother   . Hyperthyroidism Maternal Grandmother   . Sjogren's syndrome Maternal Grandmother   . Asthma Maternal Grandmother   . Diabetes Maternal Grandfather   . Hypertension Maternal Grandfather   . Cancer Maternal  Grandfather     pancreatic    History  Substance Use Topics  . Smoking status: Never Smoker   . Smokeless tobacco: Never Used  . Alcohol Use: Yes     Comment: ocassionally   OB History   Grav Para Term Preterm Abortions TAB SAB Ect Mult Living   0 0 0 0 0 0 0 0 0 0      Review of Systems  Musculoskeletal:       See history of present illness  All other systems reviewed and are negative.   Allergies  Review of patient's allergies indicates no known allergies.  Home Medications   Prior to Admission medications   Medication Sig Start Date End Date Taking? Authorizing Provider  Fluticasone-Salmeterol (ADVAIR) 100-50 MCG/DOSE AEPB Inhale 1 puff into the lungs 2 (two) times daily. 03/10/13  Yes Ardyth Gal, MD  acetaminophen (TYLENOL) 500 MG tablet Take 500 mg by mouth every 6 (six) hours as needed. Takes for pain     Historical Provider, MD  albuterol (PROVENTIL HFA;VENTOLIN HFA) 108 (90 BASE) MCG/ACT inhaler Inhale 2 puffs into the lungs every 4 (four) hours as needed. Shortness of breath 03/10/13   Ardyth Gal, MD  albuterol (PROVENTIL HFA;VENTOLIN HFA) 108 (90 BASE) MCG/ACT inhaler Inhale 1-2 puffs into the lungs every 6 (six) hours as needed for wheezing or shortness of breath. 10/30/13   Graylon Good, PA-C  baclofen (  LIORESAL) 10 MG tablet Take 1 tablet (10 mg total) by mouth 3 (three) times daily. 06/19/13   Tobey GrimJeffrey H Walden, MD  carbamazepine (TEGRETOL) 200 MG tablet Take 400 mg by mouth at bedtime.    Historical Provider, MD  cetirizine (ZYRTEC) 10 MG tablet Take 1 tablet (10 mg total) by mouth daily. 03/10/13   Ardyth Galachel Chamberlain, MD  diclofenac sodium (VOLTAREN) 1 % GEL Apply 2 g topically 4 (four) times daily. 08/08/13   Ozella Rocksavid J Merrell, MD  fluticasone (FLONASE) 50 MCG/ACT nasal spray Place 2 sprays into the nose daily. 03/10/13   Ardyth Galachel Chamberlain, MD  HYDROcodone-acetaminophen (NORCO) 7.5-325 MG per tablet Take 1 tablet by mouth every 6 (six) hours as needed for  moderate pain. 03/21/14   Graylon GoodZachary H Brody Bonneau, PA-C  HYDROcodone-acetaminophen (NORCO/VICODIN) 5-325 MG per tablet Take 1 tablet by mouth every 8 (eight) hours as needed for pain. 06/19/13   Tobey GrimJeffrey H Walden, MD  HYDROcodone-homatropine [facility](HYCODAN) 5-1.5 MG/5ML syrup Take 5 mLs by mouth every 6 (six) hours as needed for cough. 10/28/13   Stephanie Couphristopher M Street, MD  ibuprofen (ADVIL,MOTRIN) 800 MG tablet Take 1 tablet (800 mg total) by mouth every 8 (eight) hours as needed for pain. 10/28/12   Tobey GrimJeffrey H Walden, MD  ketoconazole (NIZORAL) 2 % cream Apply topically daily. 05/21/13   Ozella Rocksavid J Merrell, MD  meloxicam (MOBIC) 15 MG tablet Take 1 tablet (15 mg total) by mouth daily. 03/21/14   Graylon GoodZachary H Deshanae Lindo, PA-C  methocarbamol (ROBAXIN) 500 MG tablet Take 1-2 tablets (500-1,000 mg total) by mouth every 6 (six) hours as needed for muscle spasms. 03/21/14   Graylon GoodZachary H Muranda Coye, PA-C  predniSONE (DELTASONE) 50 MG tablet Take 1 tablet (50 mg total) by mouth daily with breakfast. 10/30/13   Graylon GoodZachary H Tauri Ethington, PA-C  traZODone (DESYREL) 50 MG tablet Take 1 tablet (50 mg total) by mouth 3 (three) times daily. 05/20/13 09/28/14  Ozella Rocksavid J Merrell, MD  triamcinolone cream (KENALOG) 0.1 % Apply topically 2 (two) times daily. Only treat for 2 weeks at a time 05/20/13   Ozella Rocksavid J Merrell, MD   BP 135/88  Pulse 78  Temp(Src) 98.8 F (37.1 C) (Oral)  Resp 18  SpO2 97%  LMP 03/14/2014 Physical Exam  Nursing note and vitals reviewed. Constitutional: She is oriented to person, place, and time. Vital signs are normal. She appears well-developed and well-nourished. No distress.  HENT:  Head: Normocephalic and atraumatic.  Pulmonary/Chest: Effort normal. No respiratory distress.  Musculoskeletal:       Cervical back: She exhibits tenderness (diffuse, nonfocal). She exhibits no swelling, no edema and no deformity.       Back:  Neurological: She is alert and oriented to person, place, and time. She has normal strength. No cranial nerve deficit.  She exhibits normal muscle tone. Coordination normal.  Skin: Skin is warm and dry. No rash noted. She is not diaphoretic.  Psychiatric: She has a normal mood and affect. Judgment normal.    ED Course  Procedures (including critical care time) Labs Review Labs Reviewed - No data to display  Imaging Review No results found.   MDM   1. Neck pain   2. Back pain   3. Muscle spasm    No plaques. Treat with NSAIDs, muscle action, pain medication. Followup when necessary if no improvement in a couple days.   Meds ordered this encounter  Medications  . methocarbamol (ROBAXIN) 500 MG tablet    Sig: Take 1-2 tablets (500-1,000 mg total)  by mouth every 6 (six) hours as needed for muscle spasms.    Dispense:  30 tablet    Refill:  0    Order Specific Question:  Supervising Provider    Answer:  Lorenz CoasterKELLER, DAVID C [id number]]  . meloxicam (MOBIC) 15 MG tablet    Sig: Take 1 tablet (15 mg total) by mouth daily.    Dispense:  30 tablet    Refill:  2    Order Specific Question:  Supervising Provider    Answer:  Lorenz CoasterKELLER, DAVID C [id number]]  . HYDROcodone-acetaminophen (NORCO) 7.5-325 MG per tablet    Sig: Take 1 tablet by mouth every 6 (six) hours as needed for moderate pain.    Dispense:  20 tablet    Refill:  0    Order Specific Question:  Supervising Provider    Answer:  Lorenz CoasterKELLER, DAVID C [[id number]]       Graylon GoodZachary H Jaiyla Granados, PA-C 03/21/14 1836

## 2014-03-27 ENCOUNTER — Emergency Department (HOSPITAL_COMMUNITY)
Admission: EM | Admit: 2014-03-27 | Discharge: 2014-03-27 | Disposition: A | Discharge status: Home / Self Care | Payer: BC Managed Care – PPO | Source: Home / Self Care

## 2014-03-27 ENCOUNTER — Encounter (HOSPITAL_COMMUNITY): Payer: Self-pay | Admitting: Emergency Medicine

## 2014-03-27 DIAGNOSIS — M609 Myositis, unspecified: Secondary | ICD-10-CM

## 2014-03-27 DIAGNOSIS — IMO0001 Reserved for inherently not codable concepts without codable children: Secondary | ICD-10-CM

## 2014-03-27 DIAGNOSIS — S46819A Strain of other muscles, fascia and tendons at shoulder and upper arm level, unspecified arm, initial encounter: Secondary | ICD-10-CM

## 2014-03-27 DIAGNOSIS — S46812D Strain of other muscles, fascia and tendons at shoulder and upper arm level, left arm, subsequent encounter: Secondary | ICD-10-CM

## 2014-03-27 DIAGNOSIS — X58XXXA Exposure to other specified factors, initial encounter: Secondary | ICD-10-CM

## 2014-03-27 DIAGNOSIS — S43499A Other sprain of unspecified shoulder joint, initial encounter: Secondary | ICD-10-CM

## 2014-03-27 MED ORDER — DICLOFENAC SODIUM 1 % TD GEL: 1.0000 "application " | Freq: Four times a day (QID) | TRANSDERMAL | Status: DC

## 2014-03-27 NOTE — ED Provider Notes (Signed)
CSN: [id number]     Arrival date & time 03/27/14  0825 History   First MD Initiated Contact with Patient 03/27/14 [phone]     Chief Complaint  Patient presents with  . Shoulder Pain   (Consider location/radiation/quality/duration/timing/severity/associated sxs/prior Treatment) HPI Comments: C/O persistent pain to the L trapezius muscle. Includes the para spinal insertion points, ridge and posterior body over the scapular region. Limited ability to rotate head. [facility] and tx with 3 medications. St not better. 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    Past Medical History  Diagnosis Date  . Asthma   . Intestinal polyps   . Depression 2003  . Insomnia 2010  . RECTAL POLYPS 07/25/2007    Qualifier: Diagnosis of  By: [name], [name]    . MENSTRUATION, PAINFUL 12/06/2006    Qualifier: Diagnosis of  By: [name], [name]    . Menorrhagia 09/19/2011   Past Surgical History  Procedure Laterality Date  . Tonsillectomy    . Cervical polypectomy    . Colonoscopy     Family History  Problem Relation Age of Onset  . Hypertension Other   . Anesthesia problems Neg Hx   . Bipolar disorder Mother   . Hypertension Mother   . Depression Maternal Aunt   . Hypothyroidism Maternal Aunt   . Cancer Maternal Aunt     breast cancer in multiple aunts  . Depression Maternal Uncle   . Depression Maternal Grandmother   . Hyperthyroidism Maternal Grandmother   . Sjogren's syndrome Maternal Grandmother   . Asthma Maternal Grandmother   . Diabetes Maternal Grandfather   . Hypertension Maternal Grandfather   . Cancer Maternal Grandfather     pancreatic    History  Substance Use Topics  . Smoking status: Never Smoker   . Smokeless tobacco: Never Used  . Alcohol Use: Yes     Comment: ocassionally   OB History   Grav Para Term Preterm Abortions TAB SAB Ect Mult Living   0 0 0 0 0 0 0 0 0 0      Review of Systems  Constitutional: Positive for  activity change. Negative for fever and fatigue.  HENT: Negative.   Respiratory: Negative.   Gastrointestinal: Negative.   Genitourinary: Negative.   Musculoskeletal: Positive for back pain and neck pain.  Skin: Negative.   Neurological: Negative for dizziness, tremors, numbness and headaches.    Allergies  Review of patient's allergies indicates no known allergies.  Home Medications   Prior to Admission medications   Medication Sig Start Date End Date Taking? Authorizing Aslee Such  acetaminophen (TYLENOL) 500 MG tablet Take 500 mg by mouth every 6 (six) hours as needed. Takes for pain     Historical Teira Arcilla, MD  albuterol (PROVENTIL HFA;VENTOLIN HFA) 108 (90 BASE) MCG/ACT inhaler Inhale 2 puffs into the lungs every 4 (four) hours as needed. Shortness of breath 03/10/13   Ardyth Galachel Chamberlain, MD  albuterol (PROVENTIL HFA;VENTOLIN HFA) 108 (90 BASE) MCG/ACT inhaler Inhale 1-2 puffs into the lungs every 6 (six) hours as needed for wheezing or shortness of breath. 10/30/13   Graylon GoodZachary H Baker, PA-C  baclofen (LIORESAL) 10 MG tablet Take 1 tablet (10 mg total) by mouth 3 (three) times daily. 06/19/13   Tobey GrimJeffrey H Walden, MD  carbamazepine (TEGRETOL) 200 MG tablet Take 400 mg by mouth at bedtime.    Historical Sheria Rosello, MD  cetirizine (ZYRTEC) 10 MG tablet Take 1 tablet (10 mg  total) by mouth daily. 03/10/13   Ardyth Galachel Chamberlain, MD  diclofenac sodium (VOLTAREN) 1 % GEL Apply 1 application topically 4 (four) times daily. 03/27/14   Hayden Rasmussenavid Mabe, NP  fluticasone (FLONASE) 50 MCG/ACT nasal spray Place 2 sprays into the nose daily. 03/10/13   Ardyth Galachel Chamberlain, MD  Fluticasone-Salmeterol (ADVAIR) 100-50 MCG/DOSE AEPB Inhale 1 puff into the lungs 2 (two) times daily. 03/10/13   Ardyth Galachel Chamberlain, MD  HYDROcodone-acetaminophen (NORCO) 7.5-325 MG per tablet Take 1 tablet by mouth every 6 (six) hours as needed for moderate pain. 03/21/14   Graylon GoodZachary H Baker, PA-C  meloxicam (MOBIC) 15 MG tablet Take 1 tablet (15 mg  total) by mouth daily. 03/21/14   Graylon GoodZachary H Baker, PA-C  methocarbamol (ROBAXIN) 500 MG tablet Take 1-2 tablets (500-1,000 mg total) by mouth every 6 (six) hours as needed for muscle spasms. 03/21/14   Graylon GoodZachary H Baker, PA-C  traZODone (DESYREL) 50 MG tablet Take 1 tablet (50 mg total) by mouth 3 (three) times daily. 05/20/13 09/28/14  Ozella Rocksavid J Merrell, MD   BP 134/86  Pulse 78  Temp(Src) 98.1 F (36.7 C) (Oral)  Resp 16  SpO2 97%  LMP 03/14/2014 Physical Exam  Nursing note and vitals reviewed. Constitutional: She is oriented to person, place, and time. She appears well-developed and well-nourished. No distress.  Eyes: Conjunctivae and EOM are normal.  Neck:  Limited bilateral rotational ROM due to L lateral neck and shoulder pain. Tender in areas of the trapezius discussed in HPI. No bony tenderness. No spinal tenderness or deformity.  Cardiovascular: Normal rate, regular rhythm and normal heart sounds.   Pulmonary/Chest: Effort normal and breath sounds normal.  Musculoskeletal: She exhibits no edema.  Lymphadenopathy:    She has no cervical adenopathy.  Neurological: She is alert and oriented to person, place, and time. She exhibits normal muscle tone.  Skin: Skin is warm and dry.  Psychiatric: She has a normal mood and affect.    ED Course  Procedures (including critical care time) Labs Review Labs Reviewed - No data to display  Imaging Review No results found.   MDM   1. Trapezius strain, left, subsequent encounter   2. Myofasciitis     Continue current meds Add diclofenac gel and place heat over the area of pain C-Collar for 3-5 days. Remove often for slow muscle stretches. F/U with PCP, may need referral to PT.    Hayden Rasmussenavid Mabe, NP 03/27/14 [phone]

## 2014-03-27 NOTE — ED Notes (Addendum)
Instructed in use and application of cervical support . Pt verbalized understanding of use, and will wear when at home, not to drive while wearing appliance (no jewelry w collar)

## 2014-03-27 NOTE — Discharge Instructions (Signed)
Myofascial Pain Syndrome °Myofascial pain syndrome is a pain disorder. This pain may be felt in the muscles. It may come and go. Myofascial pain syndrome always has trigger or tender points in the muscle that will cause pain when pressed.  °CAUSES °Myofascial pain may be caused by injuries, especially auto accidents, or by overuse of certain muscles. Typically the pain is long lasting. It is made worse by overuse of the involved muscles, emotional distress, and by cold, damp weather. Myofascial pain syndrome often develops in patients whose response to stress is an increase in muscle tone, and is seen in greater frequency in patients with pre-existing tension headaches. °SYMPTOMS  °Myofascial pain syndrome causes a wide variety of symptoms. You may see tight ropy bands of muscle. Problems may also include aching, cramping, burning, numbness, tingling, and other uncomfortable sensations in muscular areas. It most commonly affects the neck, upper back, and shoulder areas. Pain often radiates into the arms and hands.  °TREATMENT °Treatment includes resting the affected muscular area and applying ice packs to reduce spasm and pain. Trigger point injection, is a valuable initial therapy. This therapy is an injection of local anesthetic directly into the trigger point. Trigger points are often present at the source of pain. Pain relief following injection confirms the diagnosis of myofascial pain syndrome. Fairly vigorous therapy can be carried out during the pain-free period after each injection. Stretching exercises to loosen up the muscles are also useful. Transcutaneous electrical nerve stimulation (TENS) may provide relief from pain. TENS is the use of electric current produced by a device to stimulate the nerves. Ultrasound therapy applied directly over the affected muscle may also provide pain relief. Anti-inflammatory pain medicine can be helpful. Symptoms will gradually improve over a period of weeks to months  with proper treatment. °HOME CARE INSTRUCTIONS °Call your caregiver for follow-up care as recommended.  °SEEK MEDICAL CARE IF:  °Your pain is severe and not helped with medications. °Document Released: 11/02/2004 Document Revised: 12/18/2011 Document Reviewed: 11/11/2010 °ExitCare® Patient Information ©2015 [employer]. This information is not intended to replace advice given to you by your health care provider. Make sure you discuss any questions you have with your health care provider. ° °Muscle Pain °Muscle pain (myalgia) may be caused by many things, including: °· Overuse or muscle strain, especially if you are not in shape. This is the most common cause of muscle pain. °· Injury. °· Bruises. °· Viruses, such as the flu. °· Infectious diseases. °· Fibromyalgia, which is a chronic condition that causes muscle tenderness, fatigue, and headache. °· Autoimmune diseases, including lupus. °· Certain drugs, including ACE inhibitors and statins. °Muscle pain may be mild or severe. In most cases, the pain lasts only a short time and goes away without treatment. To diagnose the cause of your muscle pain, your health care provider will take your medical history. This means he or she will ask you when your muscle pain began and what has been happening. If you have not had muscle pain for very long, your health care provider may want to wait before doing much testing. If your muscle pain has lasted a long time, your health care provider may want to run tests right away. If your health care provider thinks your muscle pain may be caused by illness, you may need to have additional tests to rule out certain conditions.  °Treatment for muscle pain depends on the cause. Home care is often enough to relieve muscle pain. Your health care provider may   also prescribe anti-inflammatory medicine. °HOME CARE INSTRUCTIONS °Watch your condition for any changes. The following actions may help to lessen any discomfort you are  feeling: °· Only take over-the-counter or prescription medicines as directed by your health care provider. °· Apply ice to the sore muscle: °¨ Put ice in a plastic bag. °¨ Place a towel between your skin and the bag. °¨ Leave the ice on for 15-20 minutes, 3-4 times a day. °· You may alternate applying hot and cold packs to the muscle as directed by your health care provider. °· If overuse is causing your muscle pain, slow down your activities until the pain goes away. °¨ Remember that it is normal to feel some muscle pain after starting a workout program. Muscles that have not been used often will be sore at first. °¨ Do regular, gentle exercises if you are not usually active. °¨ Warm up before exercising to lower your risk of muscle pain. °· Do not continue working out if the pain is very bad. Bad pain could mean you have injured a muscle. °SEEK MEDICAL CARE IF: °· Your muscle pain gets worse, and medicines do not help. °· You have muscle pain that lasts longer than 3 days. °· You have a rash or fever along with muscle pain. °· You have muscle pain after a tick bite. °· You have muscle pain while working out, even though you are in good physical condition. °· You have redness, soreness, or swelling along with muscle pain. °· You have muscle pain after starting a new medicine or changing the dose of a medicine. °SEEK IMMEDIATE MEDICAL CARE IF: °· You have trouble breathing. °· You have trouble swallowing. °· You have muscle pain along with a stiff neck, fever, and vomiting. °· You have severe muscle weakness or cannot move part of your body. °MAKE SURE YOU:  °· Understand these instructions. °· Will watch your condition. °· Will get help right away if you are not doing well or get worse. °Document Released: 08/17/2006 Document Revised: 09/30/2013 Document Reviewed: 07/22/2013 °ExitCare® Patient Information ©2015 [employer]. This information is not intended to replace advice given to you by your health care  provider. Make sure you discuss any questions you have with your health care provider. ° °

## 2014-03-27 NOTE — ED Notes (Signed)
Recheck of pain i nher shoulder not relieved w Rx from 6-12.  "If anything, I feel worse"

## 2014-03-28 NOTE — ED Provider Notes (Signed)
Medical screening examination/treatment/procedure(s) were performed by a resident physician or non-physician practitioner and as the supervising physician I was immediately available for consultation/collaboration.  Clementeen GrahamEvan Corey, MD    Rodolph BongEvan S Corey, MD 03/28/14 [phone]

## 2014-10-07 ENCOUNTER — Ambulatory Visit (INDEPENDENT_AMBULATORY_CARE_PROVIDER_SITE_OTHER): Payer: BC Managed Care – PPO | Admitting: *Deleted

## 2014-10-07 ENCOUNTER — Ambulatory Visit (INDEPENDENT_AMBULATORY_CARE_PROVIDER_SITE_OTHER): Payer: BC Managed Care – PPO | Admitting: Family Medicine

## 2014-10-07 ENCOUNTER — Encounter: Payer: Self-pay | Admitting: Family Medicine

## 2014-10-07 VITALS — BP 123/81 | HR 90 | Temp 98.0°F | Ht 67.0 in | Wt 225.6 lb

## 2014-10-07 DIAGNOSIS — Z23 Encounter for immunization: Secondary | ICD-10-CM | POA: Diagnosis not present

## 2014-10-07 DIAGNOSIS — R21 Rash and other nonspecific skin eruption: Secondary | ICD-10-CM

## 2014-10-07 DIAGNOSIS — N946 Dysmenorrhea, unspecified: Secondary | ICD-10-CM | POA: Insufficient documentation

## 2014-10-07 DIAGNOSIS — M549 Dorsalgia, unspecified: Secondary | ICD-10-CM | POA: Diagnosis not present

## 2014-10-07 MED ORDER — MEFENAMIC ACID 250 MG PO CAPS: 250.0000 mg | ORAL_CAPSULE | Freq: Four times a day (QID) | ORAL | Status: DC | PRN

## 2014-10-07 MED ORDER — TRAMADOL HCL 50 MG PO TABS: 50.0000 mg | ORAL_TABLET | Freq: Three times a day (TID) | ORAL | Status: DC | PRN

## 2014-10-07 MED ORDER — SELENIUM SULFIDE 2.5 % EX LOTN: TOPICAL_LOTION | CUTANEOUS | Status: DC

## 2014-10-07 NOTE — Progress Notes (Signed)
   Subjective:    Patient ID: Katie Dyer, female    DOB: 06/03/1986, 28 y.o.   MRN: [id number]  HPI Pt presents for f/u of facial rash. It has been present for over a year but waxes and wanes in terms of severity. She has had a negative lupus work-up for it. It is flaky and itchy. She has tried steroid cream which did not help and AD ointment which helped some.  She is also complaining of severe menstrual cramps which have not been helped by ibuprofen, darvocet or vicodin.   Review of Systems See HPI    Objective:   Physical Exam  Constitutional: She is oriented to person, place, and time. She appears well-developed and well-nourished. No distress.  HENT:  Head: Normocephalic and atraumatic.  Eyes: Conjunctivae are normal. Right eye exhibits no discharge. Left eye exhibits no discharge. No scleral icterus.  Cardiovascular: Normal rate.   Pulmonary/Chest: Effort normal.  Abdominal: She exhibits no distension.  Neurological: She is alert and oriented to person, place, and time.  Skin: Skin is warm and dry. Rash noted. Rash is maculopapular. She is not diaphoretic.  Red scaly rash along hairline, in eyebrows and bilateral cheeks  Psychiatric: She has a normal mood and affect. Her behavior is normal.          Assessment & Plan:

## 2014-10-07 NOTE — Patient Instructions (Signed)
I think that your rash is seborrheic dermatitis. The best treatment for this is selsun shampoo applied a few times a week for 5 minutes. This will not get rid of it but should help.  I am also prescribing mefenamic acid for your menstrual cramps and tramadol for your back pain. I recommend you see your gynecologist for fuerther evaluation of why you have such severe menstrual cramps.

## 2014-10-07 NOTE — Assessment & Plan Note (Signed)
Chronic back pain, herniated disc last year, failed multiple drugs - trial of tramadol

## 2014-10-07 NOTE — Assessment & Plan Note (Signed)
Severe cramps and menorrhagia, does not want to do birth control, has had work up by gyn which was negative - trial of mefenamic acid (some evidence this class of nsaids more effective for dysmenorrhea)

## 2014-10-07 NOTE — Assessment & Plan Note (Signed)
Seborrheic dermatitis - selenium 2-3x/week, 5 min then rinse off

## 2014-12-09 IMAGING — CR DG CHEST 2V
2 series · 2 of 2 positions shown · non-contrast
Comparison: October 10, 2007

CLINICAL DATA: Asthma

EXAM:
CHEST  2 VIEW

[view not recorded (1 of 2)]
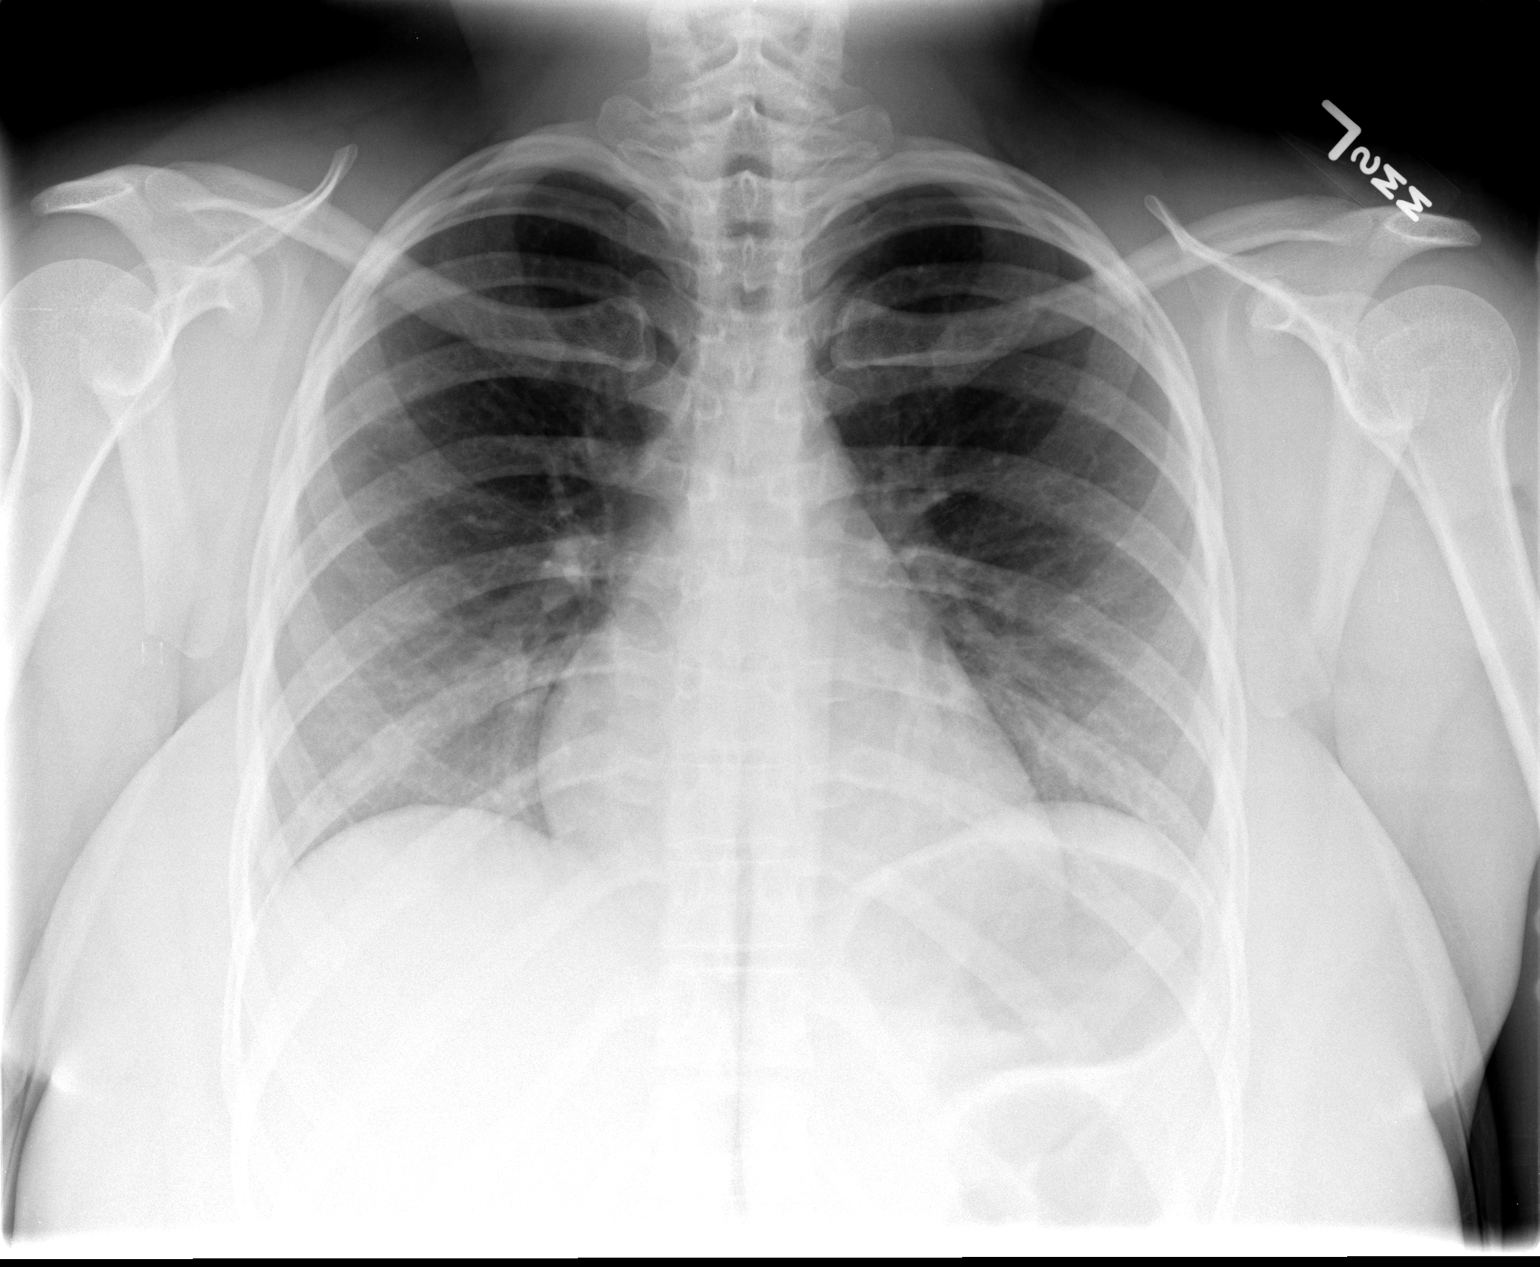

[view not recorded (2 of 2)]
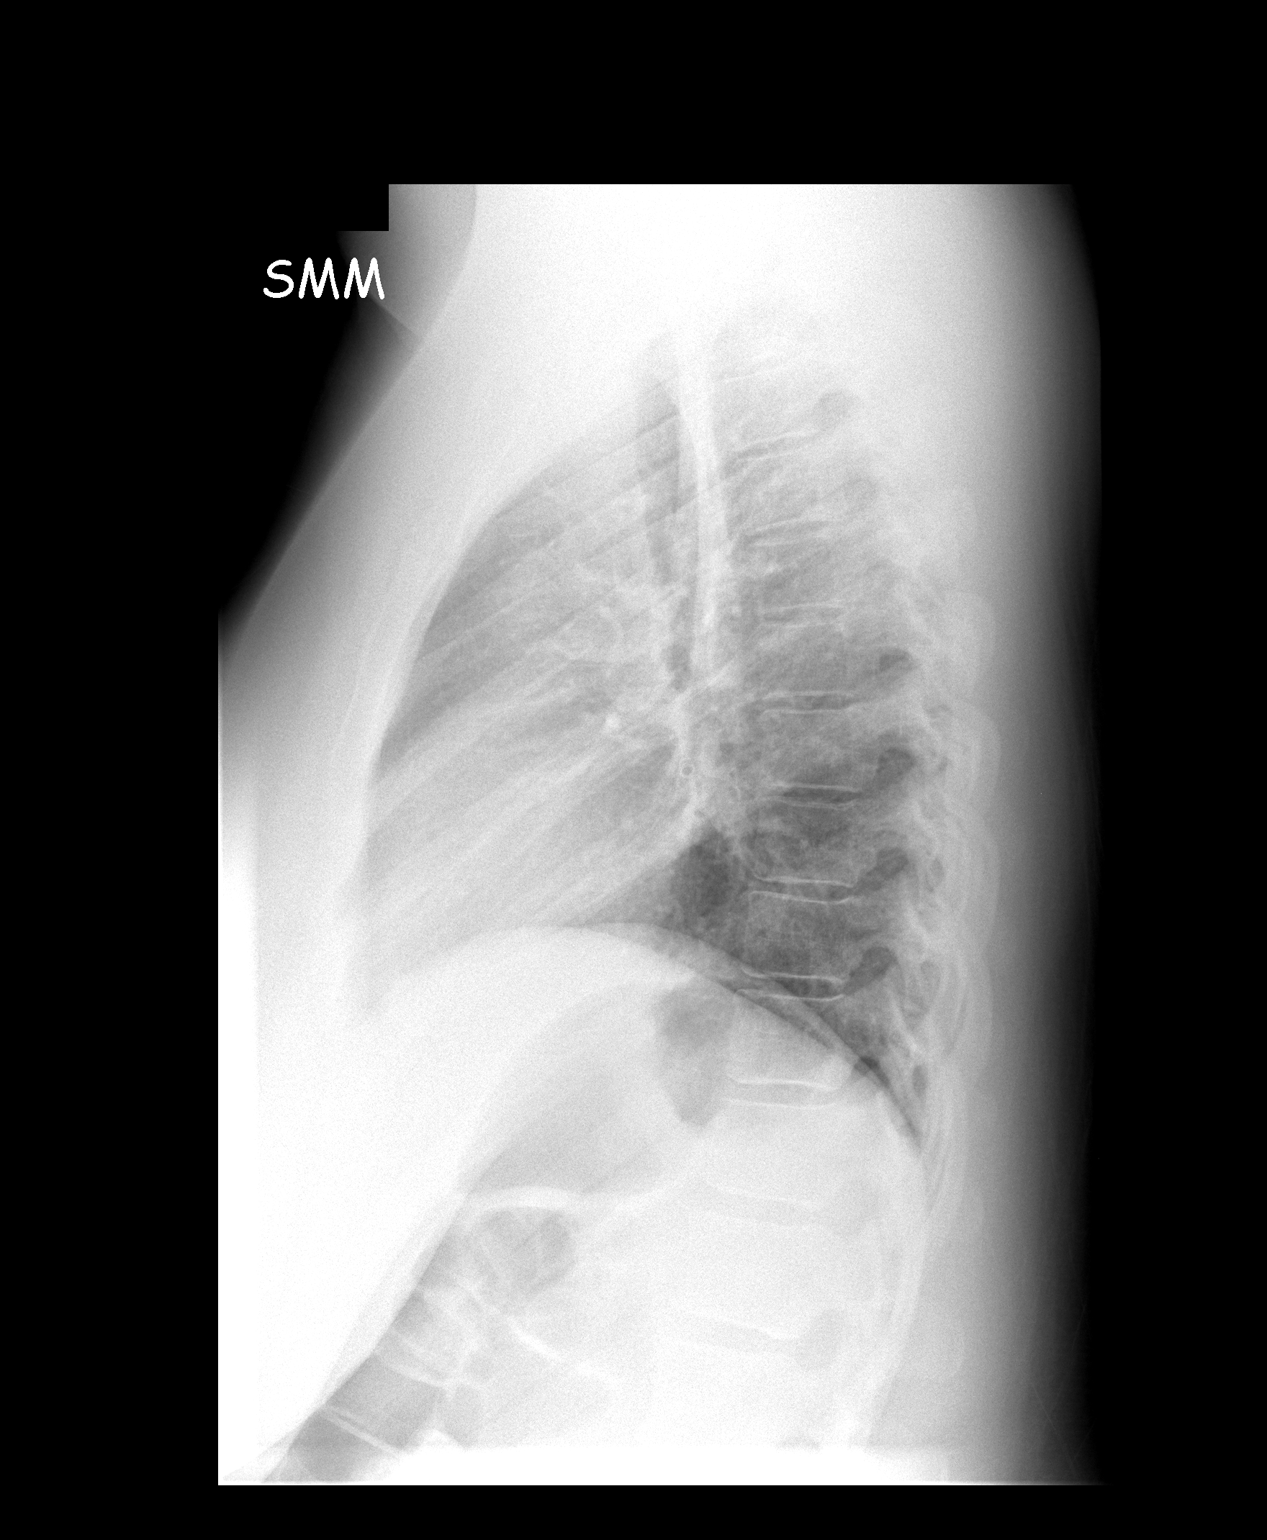

[2 of 2 positions shown; findings below may reference images not displayed]

FINDINGS: Lungs are clear. Heart size pulmonary vascularity are normal. No
adenopathy. No bone lesions.
IMPRESSION: No abnormality noted.

## 2015-01-29 ENCOUNTER — Encounter: Payer: Self-pay | Admitting: Gastroenterology

## 2015-05-24 LAB — OB RESULTS CONSOLE ABO/RH: RH Type: POSITIVE

## 2015-05-24 LAB — OB RESULTS CONSOLE ANTIBODY SCREEN: ANTIBODY SCREEN: NEGATIVE

## 2015-05-24 LAB — OB RESULTS CONSOLE GC/CHLAMYDIA
Chlamydia: NEGATIVE
GC PROBE AMP, GENITAL: NEGATIVE

## 2015-05-24 LAB — OB RESULTS CONSOLE RUBELLA ANTIBODY, IGM: Rubella: IMMUNE

## 2015-05-24 LAB — OB RESULTS CONSOLE RPR: RPR: NONREACTIVE

## 2015-05-24 LAB — OB RESULTS CONSOLE HIV ANTIBODY (ROUTINE TESTING): HIV: NONREACTIVE

## 2015-05-24 LAB — OB RESULTS CONSOLE HEPATITIS B SURFACE ANTIGEN: Hepatitis B Surface Ag: NEGATIVE

## 2015-07-22 ENCOUNTER — Ambulatory Visit (INDEPENDENT_AMBULATORY_CARE_PROVIDER_SITE_OTHER): Payer: BLUE CROSS/BLUE SHIELD | Admitting: Family Medicine

## 2015-07-22 ENCOUNTER — Encounter: Payer: Self-pay | Admitting: Family Medicine

## 2015-07-22 VITALS — BP 146/97 | HR 79 | Temp 98.1°F | Wt 218.0 lb

## 2015-07-22 DIAGNOSIS — L219 Seborrheic dermatitis, unspecified: Secondary | ICD-10-CM | POA: Diagnosis not present

## 2015-07-22 DIAGNOSIS — L918 Other hypertrophic disorders of the skin: Secondary | ICD-10-CM

## 2015-07-22 MED ORDER — ALBUTEROL SULFATE HFA 108 (90 BASE) MCG/ACT IN AERS: 1.0000 | INHALATION_SPRAY | Freq: Four times a day (QID) | RESPIRATORY_TRACT | Status: AC | PRN

## 2015-07-22 NOTE — Progress Notes (Signed)
Patient ID: Katie Dyer, female   DOB: [birth year], 29 y.o.   MRN: [id number] Date of Visit: 07/22/2015   HPI:  Katie Dyer is a 29 y.o. female presenting for evaluation of skin tag to her L cheek. Patient is currently [redacted] weeks pregnant, [facility].  Denies any pelvic pain or vaginal bleeding.  Skin tag has been present for a few months. Sometimes increases then decreases in size. No drainage.   Also has history of seborrheic dermatitis on face and requests advice to help with this. Not currently using any moisturizers on her skin. Previously used selenium sulfide shampoo, unclear if improved on this.  ROS: See HPI.  PMFSH: history seasonal allergies, sebhorreic dermatitis, depression  PHYSICAL EXAM: BP 146/97 mmHg  Pulse 79  Temp(Src) 98.1 F (36.7 C) (Oral)  Wt 218 lb (98.884 kg)  LMP 09/08/2014 Gen: NAD, pleasant, cooperative Skin: brown skin tag noted to left cheek beneath eye, slightly irregular in contour. Multiple other small brown skin tags noted on each cheek around eyes. Some dry flaky erythematous skin on face over nose and around mouth. See photo below.      ASSESSMENT/PLAN:  1. Skin tag - no signs of infection. Advised we could remove this electively if she would like it removed since it is causing her some irritation. Patient prefers to wait for now.  2. Seborrheic dermatitis - with active pregnancy, recommended trying aveeno lotion first. Could consider selenium sulfide although unclear about data in pregnancy.   FOLLOW UP: F/u as needed if symptoms worsen or do not improve.   GrenadaBrittany J. Pollie MeyerMcIntyre, MD [facility]

## 2015-07-22 NOTE — Patient Instructions (Signed)
Use aveeno facial moisturizer on your skin Let's see how this does  Let us know if you want to have the skin tag removed We could remove it here in our office or refer you to a dermatologist  Be well, Dr. Pollie MeyerMcIntyre

## 2015-08-12 ENCOUNTER — Other Ambulatory Visit (HOSPITAL_COMMUNITY): Payer: Self-pay | Admitting: Obstetrics & Gynecology

## 2015-08-12 ENCOUNTER — Ambulatory Visit (HOSPITAL_COMMUNITY)
Admission: RE | Admit: 2015-08-12 | Discharge: 2015-08-12 | Disposition: A | Discharge status: Home / Self Care | Payer: BLUE CROSS/BLUE SHIELD | Source: Ambulatory Visit | Attending: Obstetrics & Gynecology | Admitting: Obstetrics & Gynecology

## 2015-08-12 DIAGNOSIS — R52 Pain, unspecified: Secondary | ICD-10-CM | POA: Diagnosis not present

## 2015-08-12 DIAGNOSIS — M79661 Pain in right lower leg: Secondary | ICD-10-CM | POA: Diagnosis present

## 2015-08-12 NOTE — Progress Notes (Signed)
Preliminary results by tech - Right Lower Ext. Venous Duplex Completed. Negative for deep and superficial vein thrombosis in the right lower extremity. Brion Sossamon, BS, RDMS, RVT  

## 2015-11-29 LAB — OB RESULTS CONSOLE GBS: STREP GROUP B AG: POSITIVE

## 2015-12-26 ENCOUNTER — Encounter (HOSPITAL_COMMUNITY)
Admission: AD | Disposition: A | Discharge status: Home / Self Care | Payer: Self-pay | Source: Ambulatory Visit | Attending: Obstetrics & Gynecology

## 2015-12-26 ENCOUNTER — Inpatient Hospital Stay (HOSPITAL_COMMUNITY): Payer: BLUE CROSS/BLUE SHIELD | Admitting: Anesthesiology

## 2015-12-26 ENCOUNTER — Inpatient Hospital Stay (HOSPITAL_COMMUNITY)
Admission: AD | Admit: 2015-12-26 | Discharge: 2015-12-29 | DRG: 765 | Disposition: A | Discharge status: Home / Self Care | Payer: BLUE CROSS/BLUE SHIELD | Source: Ambulatory Visit | Attending: Obstetrics & Gynecology | Admitting: Obstetrics & Gynecology

## 2015-12-26 ENCOUNTER — Encounter (HOSPITAL_COMMUNITY): Payer: Self-pay

## 2015-12-26 DIAGNOSIS — Z8249 Family history of ischemic heart disease and other diseases of the circulatory system: Secondary | ICD-10-CM

## 2015-12-26 DIAGNOSIS — Z3A38 38 weeks gestation of pregnancy: Secondary | ICD-10-CM

## 2015-12-26 DIAGNOSIS — Z825 Family history of asthma and other chronic lower respiratory diseases: Secondary | ICD-10-CM | POA: Diagnosis not present

## 2015-12-26 DIAGNOSIS — O9902 Anemia complicating childbirth: Secondary | ICD-10-CM | POA: Diagnosis present

## 2015-12-26 DIAGNOSIS — O9212 Cracked nipple associated with the puerperium: Secondary | ICD-10-CM | POA: Diagnosis not present

## 2015-12-26 DIAGNOSIS — O99824 Streptococcus B carrier state complicating childbirth: Secondary | ICD-10-CM | POA: Diagnosis present

## 2015-12-26 DIAGNOSIS — O1400 Mild to moderate pre-eclampsia, unspecified trimester: Secondary | ICD-10-CM | POA: Diagnosis present

## 2015-12-26 DIAGNOSIS — Z803 Family history of malignant neoplasm of breast: Secondary | ICD-10-CM

## 2015-12-26 DIAGNOSIS — IMO0001 Reserved for inherently not codable concepts without codable children: Secondary | ICD-10-CM | POA: Diagnosis present

## 2015-12-26 DIAGNOSIS — O1404 Mild to moderate pre-eclampsia, complicating childbirth: Secondary | ICD-10-CM | POA: Diagnosis present

## 2015-12-26 DIAGNOSIS — O9952 Diseases of the respiratory system complicating childbirth: Secondary | ICD-10-CM | POA: Diagnosis present

## 2015-12-26 DIAGNOSIS — J45909 Unspecified asthma, uncomplicated: Secondary | ICD-10-CM | POA: Diagnosis present

## 2015-12-26 DIAGNOSIS — D62 Acute posthemorrhagic anemia: Secondary | ICD-10-CM | POA: Diagnosis not present

## 2015-12-26 DIAGNOSIS — D509 Iron deficiency anemia, unspecified: Secondary | ICD-10-CM | POA: Diagnosis present

## 2015-12-26 DIAGNOSIS — Z833 Family history of diabetes mellitus: Secondary | ICD-10-CM

## 2015-12-26 HISTORY — DX: Acute posthemorrhagic anemia: D62

## 2015-12-26 HISTORY — DX: Reserved for inherently not codable concepts without codable children: IMO0001

## 2015-12-26 LAB — COMPREHENSIVE METABOLIC PANEL
ALT: 14 U/L (ref 14–54)
AST: 18 U/L (ref 15–41)
Albumin: 2.6 g/dL — ABNORMAL LOW (ref 3.5–5.0)
Alkaline Phosphatase: 128 U/L — ABNORMAL HIGH (ref 38–126)
Anion gap: 8 (ref 5–15)
BILIRUBIN TOTAL: 0.4 mg/dL (ref 0.3–1.2)
BUN: 7 mg/dL (ref 6–20)
CO2: 23 mmol/L (ref 22–32)
CREATININE: 0.53 mg/dL (ref 0.44–1.00)
Calcium: 8.2 mg/dL — ABNORMAL LOW (ref 8.9–10.3)
Chloride: 107 mmol/L (ref 101–111)
GFR calc Af Amer: >60 mL/min (ref >60)
Glucose, Bld: 80 mg/dL (ref 65–99)
POTASSIUM: 3.5 mmol/L (ref 3.5–5.1)
Sodium: 138 mmol/L (ref 135–145)
TOTAL PROTEIN: 6 g/dL — AB (ref 6.5–8.1)

## 2015-12-26 LAB — CBC
HCT: 29.6 % — ABNORMAL LOW (ref 36.0–46.0)
HCT: 30.7 % — ABNORMAL LOW (ref 36.0–46.0)
Hemoglobin: 10.2 g/dL — ABNORMAL LOW (ref 12.0–15.0)
Hemoglobin: 10.4 g/dL — ABNORMAL LOW (ref 12.0–15.0)
MCH: 29.9 pg (ref 26.0–34.0)
MCH: 29.6 pg (ref 26.0–34.0)
MCHC: 34.5 g/dL (ref 30.0–36.0)
MCHC: 33.9 g/dL (ref 30.0–36.0)
MCV: 86.8 fL (ref 78.0–100.0)
MCV: 87.5 fL (ref 78.0–100.0)
PLATELETS: 304 10*3/uL (ref 150–400)
Platelets: 315 10*3/uL (ref 150–400)
RBC: 3.41 MIL/uL — ABNORMAL LOW (ref 3.87–5.11)
RBC: 3.51 MIL/uL — AB (ref 3.87–5.11)
RDW: 14.1 % (ref 11.5–15.5)
RDW: 14.1 % (ref 11.5–15.5)
WBC: 5.7 10*3/uL (ref 4.0–10.5)
WBC: 11.5 10*3/uL — ABNORMAL HIGH (ref 4.0–10.5)

## 2015-12-26 LAB — NO BLOOD PRODUCTS

## 2015-12-26 LAB — PROTEIN / CREATININE RATIO, URINE
Creatinine, Urine: 95 mg/dL
Protein Creatinine Ratio: 0.32 mg/mg{Cre} — ABNORMAL HIGH (ref 0.00–0.15)
TOTAL PROTEIN, URINE: 30 mg/dL

## 2015-12-26 LAB — ABO/RH: ABO/RH(D): O POS

## 2015-12-26 LAB — URIC ACID: Uric Acid, Serum: 4.1 mg/dL (ref 2.3–6.6)

## 2015-12-26 SURGERY — Surgical Case
Anesthesia: Epidural

## 2015-12-26 MED ORDER — MEPERIDINE HCL 25 MG/ML IJ SOLN
INTRAMUSCULAR | Status: AC
  Filled 2015-12-26: qty 1

## 2015-12-26 MED ORDER — ONDANSETRON HCL 4 MG/2ML IJ SOLN
4.0000 mg | Freq: Four times a day (QID) | INTRAMUSCULAR | Status: DC | PRN
  Administered 2015-12-26: 4 mg via INTRAVENOUS
  Filled 2015-12-26: qty 2

## 2015-12-26 MED ORDER — CEFAZOLIN SODIUM-DEXTROSE 2-3 GM-% IV SOLR
INTRAVENOUS | Status: AC
  Filled 2015-12-26: qty 50

## 2015-12-26 MED ORDER — ACETAMINOPHEN 325 MG PO TABS
650.0000 mg | ORAL_TABLET | ORAL | Status: DC | PRN
  Administered 2015-12-27 – 2015-12-28 (×2): 650 mg via ORAL
  Filled 2015-12-26 (×2): qty 2

## 2015-12-26 MED ORDER — WITCH HAZEL-GLYCERIN EX PADS: 1.0000 "application " | MEDICATED_PAD | CUTANEOUS | Status: DC | PRN

## 2015-12-26 MED ORDER — ONDANSETRON HCL 4 MG/2ML IJ SOLN
INTRAMUSCULAR | Status: DC | PRN
  Administered 2015-12-26: 4 mg via INTRAVENOUS

## 2015-12-26 MED ORDER — MEPERIDINE HCL 25 MG/ML IJ SOLN
INTRAMUSCULAR | Status: DC | PRN
  Administered 2015-12-26 (×2): 12.5 mg via INTRAVENOUS

## 2015-12-26 MED ORDER — LACTATED RINGERS IV SOLN: 500.0000 mL | Freq: Once | INTRAVENOUS | Status: DC

## 2015-12-26 MED ORDER — LACTATED RINGERS IV SOLN
1.0000 m[IU]/min | INTRAVENOUS | Status: DC
  Administered 2015-12-26: 2 m[IU]/min via INTRAVENOUS
  Filled 2015-12-26: qty 10

## 2015-12-26 MED ORDER — SIMETHICONE 80 MG PO CHEW
80.0000 mg | CHEWABLE_TABLET | Freq: Three times a day (TID) | ORAL | Status: DC
  Administered 2015-12-27 – 2015-12-29 (×8): 80 mg via ORAL
  Filled 2015-12-26 (×7): qty 1

## 2015-12-26 MED ORDER — CITRIC ACID-SODIUM CITRATE 334-500 MG/5ML PO SOLN
30.0000 mL | ORAL | Status: DC | PRN
  Administered 2015-12-26: 30 mL via ORAL
  Filled 2015-12-26: qty 15

## 2015-12-26 MED ORDER — SIMETHICONE 80 MG PO CHEW
80.0000 mg | CHEWABLE_TABLET | ORAL | Status: DC
  Administered 2015-12-26 – 2015-12-28 (×3): 80 mg via ORAL
  Filled 2015-12-26 (×3): qty 1

## 2015-12-26 MED ORDER — OXYTOCIN 10 UNIT/ML IJ SOLN: 2.5000 [IU]/h | INTRAVENOUS | Status: DC

## 2015-12-26 MED ORDER — SCOPOLAMINE 1 MG/3DAYS TD PT72
MEDICATED_PATCH | TRANSDERMAL | Status: DC | PRN
  Administered 2015-12-26: 1 via TRANSDERMAL

## 2015-12-26 MED ORDER — DIBUCAINE 1 % RE OINT: 1.0000 "application " | TOPICAL_OINTMENT | RECTAL | Status: DC | PRN

## 2015-12-26 MED ORDER — SIMETHICONE 80 MG PO CHEW
80.0000 mg | CHEWABLE_TABLET | ORAL | Status: DC | PRN
  Filled 2015-12-26: qty 1

## 2015-12-26 MED ORDER — NALBUPHINE HCL 10 MG/ML IJ SOLN: 5.0000 mg | INTRAMUSCULAR | Status: DC | PRN

## 2015-12-26 MED ORDER — PENICILLIN G POTASSIUM 5000000 UNITS IJ SOLR
5.0000 10*6.[IU] | Freq: Once | INTRAVENOUS | Status: AC
  Administered 2015-12-26: 5 10*6.[IU] via INTRAVENOUS
  Filled 2015-12-26: qty 5

## 2015-12-26 MED ORDER — LIDOCAINE HCL (PF) 1 % IJ SOLN
INTRAMUSCULAR | Status: DC | PRN
  Administered 2015-12-26: 3 mL
  Administered 2015-12-26: 2 mL
  Administered 2015-12-26: 5 mL

## 2015-12-26 MED ORDER — EPHEDRINE 5 MG/ML INJ: 10.0000 mg | INTRAVENOUS | Status: DC | PRN

## 2015-12-26 MED ORDER — MENTHOL 3 MG MT LOZG: 1.0000 | LOZENGE | OROMUCOSAL | Status: DC | PRN

## 2015-12-26 MED ORDER — KETOROLAC TROMETHAMINE 30 MG/ML IJ SOLN
30.0000 mg | Freq: Four times a day (QID) | INTRAMUSCULAR | Status: DC | PRN
  Administered 2015-12-26: 30 mg via INTRAMUSCULAR

## 2015-12-26 MED ORDER — SENNOSIDES-DOCUSATE SODIUM 8.6-50 MG PO TABS
2.0000 | ORAL_TABLET | ORAL | Status: DC
  Administered 2015-12-27 – 2015-12-28 (×2): 2 via ORAL
  Filled 2015-12-26 (×3): qty 2

## 2015-12-26 MED ORDER — MORPHINE SULFATE (PF) 0.5 MG/ML IJ SOLN
INTRAMUSCULAR | Status: AC
  Filled 2015-12-26: qty 20

## 2015-12-26 MED ORDER — ALBUTEROL SULFATE (2.5 MG/3ML) 0.083% IN NEBU: 3.0000 mL | INHALATION_SOLUTION | Freq: Four times a day (QID) | RESPIRATORY_TRACT | Status: DC | PRN

## 2015-12-26 MED ORDER — LANOLIN HYDROUS EX OINT: 1.0000 "application " | TOPICAL_OINTMENT | CUTANEOUS | Status: DC | PRN

## 2015-12-26 MED ORDER — DIPHENHYDRAMINE HCL 25 MG PO CAPS: 25.0000 mg | ORAL_CAPSULE | Freq: Four times a day (QID) | ORAL | Status: DC | PRN

## 2015-12-26 MED ORDER — LACTATED RINGERS IV SOLN
INTRAVENOUS | Status: DC
  Administered 2015-12-26 (×2): via INTRAVENOUS

## 2015-12-26 MED ORDER — OXYTOCIN BOLUS FROM INFUSION: 500.0000 mL | INTRAVENOUS | Status: DC

## 2015-12-26 MED ORDER — LACTATED RINGERS IV SOLN
40.0000 [IU] | INTRAVENOUS | Status: DC | PRN
  Administered 2015-12-26: 40 [IU] via INTRAVENOUS

## 2015-12-26 MED ORDER — METHYLERGONOVINE MALEATE 0.2 MG/ML IJ SOLN: 0.2000 mg | INTRAMUSCULAR | Status: DC | PRN

## 2015-12-26 MED ORDER — PENICILLIN G POTASSIUM 5000000 UNITS IJ SOLR
2.5000 10*6.[IU] | INTRAVENOUS | Status: DC
  Administered 2015-12-26: 2.5 10*6.[IU] via INTRAVENOUS
  Filled 2015-12-26 (×3): qty 2.5

## 2015-12-26 MED ORDER — LACTATED RINGERS IV SOLN
INTRAVENOUS | Status: DC
  Administered 2015-12-27 (×2): via INTRAVENOUS

## 2015-12-26 MED ORDER — DIPHENHYDRAMINE HCL 50 MG/ML IJ SOLN: 12.5000 mg | INTRAMUSCULAR | Status: DC | PRN

## 2015-12-26 MED ORDER — KETOROLAC TROMETHAMINE 30 MG/ML IJ SOLN
INTRAMUSCULAR | Status: AC
  Filled 2015-12-26: qty 1

## 2015-12-26 MED ORDER — PHENYLEPHRINE 40 MCG/ML (10ML) SYRINGE FOR IV PUSH (FOR BLOOD PRESSURE SUPPORT): 80.0000 ug | PREFILLED_SYRINGE | INTRAVENOUS | Status: DC | PRN

## 2015-12-26 MED ORDER — KETOROLAC TROMETHAMINE 30 MG/ML IJ SOLN: 30.0000 mg | Freq: Four times a day (QID) | INTRAMUSCULAR | Status: DC | PRN

## 2015-12-26 MED ORDER — NALOXONE HCL 2 MG/2ML IJ SOSY: 1.0000 ug/kg/h | PREFILLED_SYRINGE | INTRAVENOUS | Status: DC | PRN

## 2015-12-26 MED ORDER — FENTANYL 2.5 MCG/ML BUPIVACAINE 1/10 % EPIDURAL INFUSION (WH - ANES)
14.0000 mL/h | INTRAMUSCULAR | Status: DC | PRN
  Administered 2015-12-26: 14 mL/h via EPIDURAL
  Filled 2015-12-26: qty 125

## 2015-12-26 MED ORDER — DIPHENHYDRAMINE HCL 25 MG PO CAPS: 25.0000 mg | ORAL_CAPSULE | ORAL | Status: DC | PRN

## 2015-12-26 MED ORDER — BUPIVACAINE HCL (PF) 0.25 % IJ SOLN
INTRAMUSCULAR | Status: AC
  Filled 2015-12-26: qty 30

## 2015-12-26 MED ORDER — SCOPOLAMINE 1 MG/3DAYS TD PT72: 1.0000 | MEDICATED_PATCH | Freq: Once | TRANSDERMAL | Status: DC

## 2015-12-26 MED ORDER — BUPIVACAINE LIPOSOME 1.3 % IJ SUSP
INTRAMUSCULAR | Status: DC | PRN
  Administered 2015-12-26: 30 mL

## 2015-12-26 MED ORDER — SODIUM CHLORIDE 0.9 % IJ SOLN
INTRAMUSCULAR | Status: AC
  Filled 2015-12-26: qty 10

## 2015-12-26 MED ORDER — CEFAZOLIN SODIUM-DEXTROSE 2-3 GM-% IV SOLR
INTRAVENOUS | Status: DC | PRN
  Administered 2015-12-26: 2 g via INTRAVENOUS

## 2015-12-26 MED ORDER — PRENATAL MULTIVITAMIN CH
1.0000 | ORAL_TABLET | Freq: Every day | ORAL | Status: DC
  Administered 2015-12-27 – 2015-12-29 (×3): 1 via ORAL
  Filled 2015-12-26 (×3): qty 1

## 2015-12-26 MED ORDER — LACTATED RINGERS IV SOLN
INTRAVENOUS | Status: DC | PRN
  Administered 2015-12-26: 18:00:00 via INTRAVENOUS

## 2015-12-26 MED ORDER — NALBUPHINE HCL 10 MG/ML IJ SOLN: 5.0000 mg | Freq: Once | INTRAMUSCULAR | Status: DC | PRN

## 2015-12-26 MED ORDER — SODIUM CHLORIDE 0.9 % IR SOLN
Status: DC | PRN
  Administered 2015-12-26: 1000 mL

## 2015-12-26 MED ORDER — OXYTOCIN 10 UNIT/ML IJ SOLN
INTRAMUSCULAR | Status: AC
  Filled 2015-12-26: qty 4

## 2015-12-26 MED ORDER — LIDOCAINE HCL (PF) 1 % IJ SOLN: 30.0000 mL | INTRAMUSCULAR | Status: DC | PRN

## 2015-12-26 MED ORDER — NALOXONE HCL 0.4 MG/ML IJ SOLN: 0.4000 mg | INTRAMUSCULAR | Status: DC | PRN

## 2015-12-26 MED ORDER — SODIUM CHLORIDE 0.9% FLUSH: 3.0000 mL | INTRAVENOUS | Status: DC | PRN

## 2015-12-26 MED ORDER — ACETAMINOPHEN 325 MG PO TABS: 650.0000 mg | ORAL_TABLET | ORAL | Status: DC | PRN

## 2015-12-26 MED ORDER — ZOLPIDEM TARTRATE 5 MG PO TABS: 5.0000 mg | ORAL_TABLET | Freq: Every evening | ORAL | Status: DC | PRN

## 2015-12-26 MED ORDER — METHYLERGONOVINE MALEATE 0.2 MG PO TABS: 0.2000 mg | ORAL_TABLET | ORAL | Status: DC | PRN

## 2015-12-26 MED ORDER — METOCLOPRAMIDE HCL 5 MG/ML IJ SOLN
INTRAMUSCULAR | Status: DC | PRN
  Administered 2015-12-26: 10 mg via INTRAVENOUS

## 2015-12-26 MED ORDER — ONDANSETRON HCL 4 MG/2ML IJ SOLN
4.0000 mg | Freq: Three times a day (TID) | INTRAMUSCULAR | Status: DC | PRN
  Administered 2015-12-26: 4 mg via INTRAVENOUS
  Filled 2015-12-26: qty 2

## 2015-12-26 MED ORDER — MORPHINE SULFATE (PF) 0.5 MG/ML IJ SOLN
INTRAMUSCULAR | Status: DC | PRN
  Administered 2015-12-26: 4 mg via EPIDURAL

## 2015-12-26 MED ORDER — MEPERIDINE HCL 25 MG/ML IJ SOLN: 6.2500 mg | INTRAMUSCULAR | Status: DC | PRN

## 2015-12-26 MED ORDER — OXYTOCIN 10 UNIT/ML IJ SOLN: 2.5000 [IU]/h | INTRAVENOUS | Status: AC

## 2015-12-26 MED ORDER — PHENYLEPHRINE 40 MCG/ML (10ML) SYRINGE FOR IV PUSH (FOR BLOOD PRESSURE SUPPORT)
80.0000 ug | PREFILLED_SYRINGE | INTRAVENOUS | Status: DC | PRN
  Filled 2015-12-26: qty 20

## 2015-12-26 MED ORDER — TERBUTALINE SULFATE 1 MG/ML IJ SOLN: 0.2500 mg | Freq: Once | INTRAMUSCULAR | Status: DC | PRN

## 2015-12-26 MED ORDER — IBUPROFEN 600 MG PO TABS
600.0000 mg | ORAL_TABLET | Freq: Four times a day (QID) | ORAL | Status: DC
  Administered 2015-12-27 – 2015-12-29 (×11): 600 mg via ORAL
  Filled 2015-12-26 (×11): qty 1

## 2015-12-26 MED ORDER — TETANUS-DIPHTH-ACELL PERTUSSIS 5-2.5-18.5 LF-MCG/0.5 IM SUSP
0.5000 mL | Freq: Once | INTRAMUSCULAR | Status: DC
Start: 2015-12-27 — End: 2015-12-29

## 2015-12-26 MED ORDER — LACTATED RINGERS IV SOLN
500.0000 mL | INTRAVENOUS | Status: DC | PRN
  Administered 2015-12-26: 500 mL via INTRAVENOUS

## 2015-12-26 MED ORDER — ONDANSETRON HCL 4 MG/2ML IJ SOLN
INTRAMUSCULAR | Status: AC
  Filled 2015-12-26: qty 2

## 2015-12-26 SURGICAL SUPPLY — 35 items
CLAMP CORD UMBIL (MISCELLANEOUS) IMPLANT
CLOTH BEACON ORANGE TIMEOUT ST (SAFETY) ×3 IMPLANT
CONTAINER PREFILL 10% NBF 15ML (MISCELLANEOUS) IMPLANT
DRSG OPSITE POSTOP 4X10 (GAUZE/BANDAGES/DRESSINGS) ×3 IMPLANT
DURAPREP 26ML APPLICATOR (WOUND CARE) ×3 IMPLANT
ELECT REM PT RETURN 9FT ADLT (ELECTROSURGICAL) ×3
ELECTRODE REM PT RTRN 9FT ADLT (ELECTROSURGICAL) ×1 IMPLANT
EXTRACTOR VACUUM M CUP 4 TUBE (SUCTIONS) IMPLANT
EXTRACTOR VACUUM M CUP 4' TUBE (SUCTIONS)
GLOVE BIO SURGEON STRL SZ7.5 (GLOVE) ×3 IMPLANT
GLOVE BIOGEL PI IND STRL 7.0 (GLOVE) ×1 IMPLANT
GLOVE BIOGEL PI INDICATOR 7.0 (GLOVE) ×2
GOWN STRL REUS W/TWL LRG LVL3 (GOWN DISPOSABLE) ×6 IMPLANT
KIT ABG SYR 3ML LUER SLIP (SYRINGE) IMPLANT
NDL HYPO 25X5/8 SAFETYGLIDE (NEEDLE) IMPLANT
NDL SPNL 20GX3.5 QUINCKE YW (NEEDLE) IMPLANT
NEEDLE HYPO 22GX1.5 SAFETY (NEEDLE) ×3 IMPLANT
NEEDLE HYPO 25X5/8 SAFETYGLIDE (NEEDLE) IMPLANT
NEEDLE SPNL 20GX3.5 QUINCKE YW (NEEDLE) IMPLANT
NS IRRIG 1000ML POUR BTL (IV SOLUTION) ×3 IMPLANT
PACK C SECTION WH (CUSTOM PROCEDURE TRAY) ×3 IMPLANT
PENCIL SMOKE EVAC W/HOLSTER (ELECTROSURGICAL) ×3 IMPLANT
SUT MNCRL 0 VIOLET CTX 36 (SUTURE) ×2 IMPLANT
SUT MNCRL AB 3-0 PS2 27 (SUTURE) IMPLANT
SUT MON AB 2-0 CT1 27 (SUTURE) ×3 IMPLANT
SUT MON AB-0 CT1 36 (SUTURE) ×6 IMPLANT
SUT MONOCRYL 0 CTX 36 (SUTURE) ×4
SUT PLAIN 0 NONE (SUTURE) IMPLANT
SUT PLAIN 2 0 (SUTURE)
SUT PLAIN 2 0 XLH (SUTURE) ×2 IMPLANT
SUT PLAIN ABS 2-0 CT1 27XMFL (SUTURE) IMPLANT
SYR 20CC LL (SYRINGE) IMPLANT
SYR CONTROL 10ML LL (SYRINGE) ×5 IMPLANT
TOWEL OR 17X24 6PK STRL BLUE (TOWEL DISPOSABLE) ×3 IMPLANT
TRAY FOLEY CATH SILVER 14FR (SET/KITS/TRAYS/PACK) ×3 IMPLANT

## 2015-12-26 NOTE — Progress Notes (Signed)
Reason for c/s: Active phase arrest, per Dr. Billy Coastaavon

## 2015-12-26 NOTE — Anesthesia Procedure Notes (Signed)
Epidural Patient location during procedure: OB  Staffing Anesthesiologist: Marcene DuosFITZGERALD, Lian Tanori Performed by: anesthesiologist   Preanesthetic Checklist Completed: patient identified, site marked, surgical consent, pre-op evaluation, timeout performed, IV checked, risks and benefits discussed and monitors and equipment checked  Epidural Patient position: sitting Prep: site prepped and draped and DuraPrep Patient monitoring: continuous pulse ox and blood pressure Approach: midline Location: L4-L5 Injection technique: LOR saline  Needle:  Needle type: Tuohy  Needle gauge: 17 G Needle length: 9 cm and 9 Needle insertion depth: 8 cm Catheter type: closed end flexible Catheter size: 19 Gauge Catheter at skin depth: 14 (13cm initially. Advanced to 14cm when pt laid in left lat decub position.) cm Test dose: negative  Assessment Events: blood not aspirated, injection not painful, no injection resistance, negative IV test and no paresthesia

## 2015-12-26 NOTE — Progress Notes (Signed)
Katie HollingsheadOnyeka S Wander is a 30 y.o. G1P0000 at 5157w6d  Subjective: S/p epidural, feels well. HA resolved  Objective: BP 139/90 mmHg  Pulse 79  Temp(Src) 97.7 F (36.5 C) (Axillary)  Resp 18  Ht 5\' 7"  (1.702 m)  Wt 222 lb (100.699 kg)  BMI 34.76 kg/m2  SpO2 99%  LMP 09/08/2014   FHT:  FHR: 145 bpm, variability: moderate,  accelerations:  Present,  decelerations:  Absent UC:   regular, every 2-3 minutes, pitocin at 6 mu  SVE:   Dilation: 5 Effacement (%): 90 Station: -3 Exam by:: Dr. Juliene PinaMody AROM, clear fluid, high station but cervix well applied to head  Labs: Lab Results  Component Value Date   WBC 5.7 12/26/2015   HGB 10.2* 12/26/2015   HCT 29.6* 12/26/2015   MCV 86.8 12/26/2015   PLT 304 12/26/2015    Assessment / Plan: Induction of labor due to preeclampsia,  progressing well on pitocin Sp Epidural   Labor: Progressing normally Preeclampsia:  no signs or symptoms of toxicity Fetal Wellbeing:  Category I Pain Control:  Epidural I/D:  GBS(+) on PCN Anticipated MOD:  Guarded, narrow subpubic arch, adroid pelvis, pt aware  Tildon Silveria R 12/26/2015, 4:35 PM

## 2015-12-26 NOTE — H&P (Addendum)
Katie Dyer is a 30 y.o. female G1 at 38.6 wks, who is being admitted for labor induction from MAU for mild Preeclampsia by BP and urine p/c ratio of 0.32 , pt also report mild frontal HA, no scotoma, no epig pain. Pt was asked to come to MAU for evaluation of vaginal bleeding, bright red once this morning and brown/pink since then, cramping. FMs better since arriving to MAU.   [facility]- Dr Seymour BarsLavoie, [facility], declining blood products. Maternal obesity, low wt gain in preg, EFW 68% at 32 wks. Passed 3 hr GTT. GBS(+). Asthma, stable. BP elevated 130s/ 90s at last visit 4 days back.   History OB History    Gravida Para Term Preterm AB TAB SAB Ectopic Multiple Living   1 0 0 0 0 0 0 0 0 0      Past Medical History  Diagnosis Date  . Asthma   . Intestinal polyps   . Depression 2003  . Insomnia 2010  . RECTAL POLYPS 07/25/2007    Qualifier: Diagnosis of  By: [name], [name]    . MENSTRUATION, PAINFUL 12/06/2006    Qualifier: Diagnosis of  By: [name], [name]    . Menorrhagia 09/19/2011   Past Surgical History  Procedure Laterality Date  . Tonsillectomy    . Cervical polypectomy    . Colonoscopy     Family History: family history includes Asthma in her maternal grandmother; Bipolar disorder in her mother; Cancer in her maternal aunt and maternal grandfather; Depression in her maternal aunt, maternal grandmother, and maternal uncle; Diabetes in her maternal grandfather; Hypertension in her maternal grandfather, mother, and other; Hyperthyroidism in her maternal grandmother; Hypothyroidism in her maternal aunt; Sjogren's syndrome in her maternal grandmother. There is no history of Anesthesia problems. Social History:  reports that she has never smoked. She has never used smokeless tobacco. She reports that she drinks alcohol. She reports that she does not use illicit drugs.   Prenatal Transfer Tool  Maternal Diabetes: No Genetic Screening: Normal Maternal Ultrasounds/Referrals:  Normal Fetal Ultrasounds or other Referrals:  None Maternal Substance Abuse:  No Significant Maternal Medications:  None Significant Maternal Lab Results:  Lab values include: Group B Strep positive Other Comments:  JEHOVAH's WITNESS  ROS neg except HA  Dilation: 1.5 Effacement (%): 80 Station: -2 Exam by:: dr Lestat Golob Blood pressure 145/93, pulse 84, temperature 97.6 F (36.4 C), temperature source Oral, resp. rate 18, height 5\' 7"  (1.702 m), weight 222 lb (100.699 kg), last menstrual period 09/08/2014. Exam Physical Exam   A&O x 3, no acute distress. Pleasant HEENT neg, no thyromegaly Lungs CTA bilat CV RRR, S1S2 normal Abdo soft, non tender, non acute, uterus non tender and relaxed in b/w UCs Extr trace edema/ no tenderness/ DTR +1/+1 Pelvic above, minimal brown mucous dc noted. Tight pelvis/ Android FHT 150s / + accels/ no decels/ mod variab- category I Toco q 4-5 min, mild  Prenatal labs: ABO, Rh: O/Positive/-- (08/15 0000) Antibody: Negative (08/15 0000) Rubella: Immune (08/15 0000) RPR: Nonreactive (08/15 0000)  HBsAg: Negative (08/15 0000)  HIV: Non-reactive (08/15 0000)  GBS: Positive (02/20 0000)  3hr GTT normal QUAD normal   CBC    Component Value Date/Time   WBC 5.7 12/26/2015 0952   RBC 3.41* 12/26/2015 0952   HGB 10.2* 12/26/2015 0952   HCT 29.6* 12/26/2015 0952   PLT 304 12/26/2015 0952   MCV 86.8 12/26/2015 0952   MCH 29.9 12/26/2015 0952   MCHC 34.5 12/26/2015 0952  RDW 14.1 12/26/2015 0952   LYMPHSABS 2.9 05/20/2013 1650   MONOABS 0.7 05/20/2013 1650   EOSABS 0.1 05/20/2013 1650   BASOSABS 0.0 05/20/2013 1650   CMP Latest Ref Rng 12/26/2015 06/07/2012 08/20/2009  Glucose 65 - 99 mg/dL 80 75 81  BUN 6 - 20 mg/dL Creatinine 0.44 - 1.00 mg/dL 1.61 0.96 0.45  Sodium 135 - 145 mmol/L 138 137 142  Potassium 3.5 - 5.1 mmol/L 3.5 3.9 3.6  Chloride 101 - 111 mmol/L 107 102 104  CO2 22 - 32 mmol/L Calcium 8.9 - 10.3 mg/dL 8.2(L)  8.9 9.1  Total Protein 6.5 - 8.1 g/dL 6.0(L) 6.8 -  Total Bilirubin 0.3 - 1.2 mg/dL 0.4 0.4 -  Alkaline Phos 38 - 126 U/L 128(H) 43 -  AST 15 - 41 U/L 18 15 -  ALT 14 - 54 U/L 14 16 -   Uric acid 4.1  Urine Prot/crea ratio- 0.32  Assessment/Plan: 30 yo G1 at 38.6 wks. Admitting for labor IOL for mild preeclampsia. Labs normal except urine P/C 0.32 Favorable cervix, proceed with Pitocin. PCN for GBS, watch closely since pt not aware of allergy (never used before?) EFW 7 lbs.  Epidural/ pain meds reviewed.   Reviewed IOL indication and plan, reviewed risk of C/section with IOL. Reviewed eclampsia risk and warning s/s, add magnesium sulphate pp and ICU admission for that. No antiHTN med needed at present. Pt and husband voice understanding and agree/  Informed MD call switch at 5 pm with Dr Billy Coast.   Jamol Ginyard R 12/26/2015, 12:29 PM

## 2015-12-26 NOTE — Progress Notes (Signed)
Katie HollingsheadOnyeka S Dyer is a 30 y.o. G1P0000 at 6550w6d by LMP admitted for induction of labor due to Hypertension.  Subjective: Comfortable No HA or CP No SOB  Objective: BP 129/73 mmHg  Pulse 91  Temp(Src) 98.5 F (36.9 C) (Oral)  Resp 18  Ht 5\' 7"  (1.702 m)  Wt 100.699 kg (222 lb)  BMI 34.76 kg/m2  SpO2 99%  LMP 09/08/2014      FHT:  FHR: 145 bpm, variability: moderate,  accelerations:  Present,  decelerations:  Present occ variable , occ late, but improved UC:   regular, every 2 minutes SVE:   Dilation: 4 Effacement (%): 90 Station: -3 Exam by:: Dr. Juliene PinaMody  Labs: Lab Results  Component Value Date   WBC 5.7 12/26/2015   HGB 10.2* 12/26/2015   HCT 29.6* 12/26/2015   MCV 86.8 12/26/2015   PLT 304 12/26/2015    Assessment / Plan: Arrest in active phase of labor  Gestational HTN Intermittent decels improved.  Labor: active phase arrest Preeclampsia:  no signs or symptoms of toxicity, intake and ouput balanced and labs stable Fetal Wellbeing:  Category II Pain Control:  Epidural I/D:  n/a Anticipated MOD:  proceed with csection. Consent done.  Katie Dyer J 12/26/2015, 6:08 PM

## 2015-12-26 NOTE — MAU Note (Signed)
Pt c/o having some spotting last night at home. Pt reports vaginal bleeding like a period around 7am. Pt is feeling some cramping. Pt states baby is moving normally.

## 2015-12-26 NOTE — MAU Provider Note (Signed)
38.6 wks, G1, called this AM with bright red blood when wiping and then brown, cramping. FMs ok but not as active this AM. She was asked to come to MAU for eval. Here FHT category I, UCs noted. BPs elevated, PEC labs ordered, continue monitoring, RN informed.

## 2015-12-26 NOTE — Transfer of Care (Signed)
Immediate Anesthesia Transfer of Care Note  Patient: Katie Dyer  Procedure(s) Performed: Procedure(s): CESAREAN SECTION (N/A)  Patient Location: PACU  Anesthesia Type:Epidural  Level of Consciousness: awake, alert  and oriented  Airway & Oxygen Therapy: Patient Spontanous Breathing  Post-op Assessment: Report given to RN and Post -op Vital signs reviewed and stable  Post vital signs: Reviewed and stable  Last Vitals:  Filed Vitals:   12/26/15 1730 12/26/15 1800  BP: 129/73 119/79  Pulse: 91 89  Temp: 36.9 C   Resp: 18 18    Complications: No apparent anesthesia complications

## 2015-12-26 NOTE — Anesthesia Postprocedure Evaluation (Signed)
Anesthesia Post Note  Patient: Katie Dyer  Procedure(s) Performed: Procedure(s) (LRB): CESAREAN SECTION (N/A)  Patient location during evaluation: PACU Anesthesia Type: Epidural Level of consciousness: awake and alert Pain management: pain level controlled Vital Signs Assessment: post-procedure vital signs reviewed and stable Respiratory status: spontaneous breathing, nonlabored ventilation and respiratory function stable Cardiovascular status: stable Postop Assessment: no headache, no backache and epidural receding Anesthetic complications: no    Last Vitals:  Filed Vitals:   12/26/15 2024 12/26/15 2030  BP:  140/83  Pulse: 102 103  Temp:  36.9 C  Resp: 24 18    Last Pain:  Filed Vitals:   12/26/15 2037  PainSc: 0-No pain                 Kennieth RadFitzgerald, Robertt Buda E

## 2015-12-26 NOTE — Anesthesia Preprocedure Evaluation (Addendum)
Anesthesia Evaluation  Patient identified by MRN, date of birth, ID band Patient awake    Reviewed: Allergy & Precautions, Patient's Chart, lab work & pertinent test results  Airway Mallampati: II       Dental   Pulmonary asthma ,    Pulmonary exam normal breath sounds clear to auscultation       Cardiovascular hypertension, Normal cardiovascular exam  IOL for pre-E   Neuro/Psych Depression negative neurological ROS     GI/Hepatic negative GI ROS, Neg liver ROS,   Endo/Other  negative endocrine ROS  Renal/GU negative Renal ROS     Musculoskeletal   Abdominal   Peds  Hematology negative hematology ROS (+) JEHOVAH'S WITNESS  Anesthesia Other Findings   Reproductive/Obstetrics (+) Pregnancy                            Lab Results  Component Value Date   WBC 5.7 12/26/2015   HGB 10.2* 12/26/2015   HCT 29.6* 12/26/2015   MCV 86.8 12/26/2015   PLT 304 12/26/2015   Lab Results  Component Value Date   CREATININE 0.53 12/26/2015   BUN 7 12/26/2015   NA 138 12/26/2015   K 3.5 12/26/2015   CL 107 12/26/2015   CO2 23 12/26/2015    Anesthesia Physical Anesthesia Plan  ASA: II  Anesthesia Plan: Epidural   Post-op Pain Management:    Induction:   Airway Management Planned:   Additional Equipment:   Intra-op Plan:   Post-operative Plan:   Informed Consent: I have reviewed the patients History and Physical, chart, labs and discussed the procedure including the risks, benefits and alternatives for the proposed anesthesia with the patient or authorized representative who has indicated his/her understanding and acceptance.     Plan Discussed with: CRNA and Surgeon  Anesthesia Plan Comments:        Anesthesia Quick Evaluation

## 2015-12-26 NOTE — Op Note (Signed)
Cesarean Section Procedure Note  Indications: failure to progress: arrest of dilation  Pre-operative Diagnosis: 38 week 6 day pregnancy.  Post-operative Diagnosis: same  Surgeon: Lenoard AdenAAVON,Evyn Putzier J   Assistants: Arita Missawson, CNM  Anesthesia: Epidural anesthesia and Local anesthesia 0.25.% bupivacaine  ASA Class: 2  Procedure Details  The patient was seen in the Holding Room. The risks, benefits, complications, treatment options, and expected outcomes were discussed with the patient.  The patient concurred with the proposed plan, giving informed consent. The risks of anesthesia, infection, bleeding and possible injury to other organs discussed. Injury to bowel, bladder, or ureter with possible need for repair discussed. Possible need for transfusion with secondary risks of hepatitis or HIV acquisition discussed. Post operative complications to include but not limited to DVT, PE and Pneumonia noted. The site of surgery properly noted/marked. The patient was taken to Operating Room # 9, identified as Katie Dyer and the procedure verified as C-Section Delivery. A Time Out was held and the above information confirmed.  After induction of anesthesia, the patient was draped and prepped in the usual sterile manner. A Pfannenstiel incision was made and carried down through the subcutaneous tissue to the fascia. Fascial incision was made and extended transversely using Mayo scissors. The fascia was separated from the underlying rectus tissue superiorly and inferiorly. The peritoneum was identified and entered. Peritoneal incision was extended longitudinally. The utero-vesical peritoneal reflection was incised transversely and the bladder flap was bluntly freed from the lower uterine segment. A low transverse uterine incision(Kerr hysterotomy) was made. Delivered from OA presentation was a  female with Apgar scores of 8 at one minute and 9 at five minutes. Bulb suctioning gently performed. Neonatal team in  attendance.After the umbilical cord was clamped and cut cord blood was obtained for evaluation. The placenta was removed intact and appeared normal. The uterus was curetted with a dry lap pack. Good hemostasis was noted.The uterine outline, tubes and ovaries appeared normal. The uterine incision was closed with running locked sutures of 0 Monocryl x 2 layers. Hemostasis was observed. The parietal peritoneum was closed with a running 2-0 Monocryl suture. The fascia was then reapproximated with running sutures of 0 Monocryl. The skin was reapproximated with 3-0 monocryl after [place] closure with 2-0 plain.  Instrument, sponge, and needle counts were correct prior the abdominal closure and at the conclusion of the case.   Findings: FTLM, OA, post placenta, nl tubes and ovaries  Estimated Blood Loss:  300 mL         Drains: foley                 Specimens: placenta                 Complications:  None; patient tolerated the procedure well.         Disposition: PACU - hemodynamically stable.         Condition: stable  Attending Attestation: I performed the procedure.

## 2015-12-26 NOTE — MAU Provider Note (Addendum)
History     CSN: [id number]  Arrival date and time: 12/26/15 0846   None     Chief Complaint  Patient presents with  . Vaginal Bleeding   HPI Cramping all night and noted bloody discharge this morning on wiping. Since then cramping off and on and noted brown/pink vaginal dc. No leaking fluid. FMs present but reduced until arrived here, feels active FMs here in MAU.  C/o slight frontal HA since 20 minutes, no SOB/ swelling/ epig pain/ vision changes PNCare Dr Seymour Bars, uncomplicated. BP slightly elevated at last visit   Past Medical History  Diagnosis Date  . Asthma   . Intestinal polyps   . Depression 2003  . Insomnia 2010  . RECTAL POLYPS 07/25/2007    Qualifier: Diagnosis of  By: Constance Goltz MD, Molli Hazard    . MENSTRUATION, PAINFUL 12/06/2006    Qualifier: Diagnosis of  By: Levada Schilling    . Menorrhagia 09/19/2011    Past Surgical History  Procedure Laterality Date  . Tonsillectomy    . Cervical polypectomy    . Colonoscopy      Family History  Problem Relation Age of Onset  . Hypertension Other   . Anesthesia problems Neg Hx   . Bipolar disorder Mother   . Hypertension Mother   . Depression Maternal Aunt   . Hypothyroidism Maternal Aunt   . Cancer Maternal Aunt     breast cancer in multiple aunts  . Depression Maternal Uncle   . Depression Maternal Grandmother   . Hyperthyroidism Maternal Grandmother   . Sjogren's syndrome Maternal Grandmother   . Asthma Maternal Grandmother   . Diabetes Maternal Grandfather   . Hypertension Maternal Grandfather   . Cancer Maternal Grandfather     pancreatic     Social History  Substance Use Topics  . Smoking status: Never Smoker   . Smokeless tobacco: Never Used  . Alcohol Use: Yes     Comment: ocassionally    Allergies: No Known Allergies  Prescriptions prior to admission  Medication Sig Dispense Refill Last Dose  . acetaminophen (TYLENOL) 500 MG tablet Take 500 mg by mouth every 6 (six) hours as needed for moderate  pain.   prn  . cetirizine (ZYRTEC) 10 MG tablet Take 10 mg by mouth daily.   prn  . Prenatal Vit-Fe Fumarate-FA (MULTIVITAMIN-PRENATAL) 27-0.8 MG TABS tablet Take 1 tablet by mouth daily at 12 noon.   12/25/2015 at Unknown time  . albuterol (PROVENTIL HFA;VENTOLIN HFA) 108 (90 BASE) MCG/ACT inhaler Inhale 1-2 puffs into the lungs every 6 (six) hours as needed for wheezing or shortness of breath. 1 Inhaler 2 prn    ROS Physical Exam   Blood pressure 149/101, pulse 88, temperature 97.6 F (36.4 C), temperature source Oral, resp. rate 18, height  (1.702 m), weight 222 lb (100.699 kg), last menstrual period 09/08/2014. BP range - 139/94, 149/101  Physical Exam A&O x 3, no acute distress. Pleasant Lungs CTA bilat CV RRR Abdo soft, non tender, non acute, soft relaxed gravid uterus Extr no edema/ tenderness. DTR +1/+1, trace swelling Pelvic 2/80%/-2/ VTX, intact, brown dc noted.  FHT  150s/ + accels/ no decels/ mod variab- category I Toco occasional   MAU Course  Procedures PEC labs, urine p/c ratio sent  Assessment and Plan  G1 at 38.6 wks, presenting with slight bleeding/ bloody show and cramping, early labor, exam and FHT not indicate abruptio. FHT- category I Elevated BPs- likely transient HTN, but PEC labs sent.  If  labs nl, plan dc/ home and recheck BP in 48 hrs, if labs abn, plan labor IOL  Braeleigh Pyper R 12/26/2015, 10:58 AM   Urine P/C ratio - 0.32, recommend IOL for mild PEC. Will discuss PP magnesium

## 2015-12-26 NOTE — Consult Note (Signed)
[facility] 12/26/2015  7:03 PM  Delivery Note:  Cesarean Section        Katie Dyer        MRN:  [id number]  I was called to OR at the request of the patient's obstetrician Dr. Tatum for a C/S due to arrest of labor and NRFHR. Patient was an IOL for preeclampsia with mild features. No other complications in pregnancy. Patient is a 29 year old,  G1P0,  O positive, negative serologies. GBS positive, adequately treated with PenG. AROM occurred at 1624 today with clear fluid.     DELIVERY:   Infant delivered to warmer vigorous. There was no delay in cord clamping. He was dried, stimulated and bulb suctioned. APGARS 8 (-2 for color) at 1 minute and 9 ( -1 for color).   After 5 minutes, baby left with baby nurse to assist parents with skin-to-skin care. ____________________ Electronically Signed By: Nekita Pita, NNP-BC  

## 2015-12-26 NOTE — Progress Notes (Signed)
Katie HollingsheadOnyeka S Dyer is a 30 y.o. G1P0000 at 5449w6d by LMP admitted for induction of labor due to Hypertension.  Subjective: comfortable  Objective: BP 126/70 mmHg  Pulse 91  Temp(Src) 97.7 F (36.5 C) (Axillary)  Resp 20  Ht 5\' 7"  (1.702 m)  Wt 100.699 kg (222 lb)  BMI 34.76 kg/m2  SpO2 99%  LMP 09/08/2014      FHT:  FHR: 145 bpm, variability: moderate,  accelerations:  Abscent,  decelerations:  Present occ late UC:   regular, every 2 minutes SVE:   Dilation: 5 Effacement (%): 90 Station: -3 Exam by:: Dr. Juliene PinaMody  Labs: Lab Results  Component Value Date   WBC 5.7 12/26/2015   HGB 10.2* 12/26/2015   HCT 29.6* 12/26/2015   MCV 86.8 12/26/2015   PLT 304 12/26/2015    Assessment / Plan: Gestational HTN- mild IOL  New onset decelerations  Labor: AROM at 1600  Preeclampsia:  no signs or symptoms of toxicity Fetal Wellbeing:  Category II Pain Control:  Epidural I/D:  n/a Anticipated MOD:  csection likely. Reck at 1800. Continue LLRP, and reassess FHR.  Katie Dyer 12/26/2015, 5:24 PM

## 2015-12-27 ENCOUNTER — Encounter (HOSPITAL_COMMUNITY): Payer: Self-pay | Admitting: Obstetrics and Gynecology

## 2015-12-27 LAB — TYPE AND SCREEN
ABO/RH(D): O POS
Antibody Screen: NEGATIVE

## 2015-12-27 LAB — CBC
HEMATOCRIT: 26.7 % — AB (ref 36.0–46.0)
HEMOGLOBIN: 9.1 g/dL — AB (ref 12.0–15.0)
MCH: 29.7 pg (ref 26.0–34.0)
MCHC: 34.1 g/dL (ref 30.0–36.0)
MCV: 87.3 fL (ref 78.0–100.0)
Platelets: 290 10*3/uL (ref 150–400)
RBC: 3.06 MIL/uL — AB (ref 3.87–5.11)
RDW: 14.3 % (ref 11.5–15.5)
WBC: 9.5 10*3/uL (ref 4.0–10.5)

## 2015-12-27 LAB — RPR: RPR: NONREACTIVE

## 2015-12-27 NOTE — Progress Notes (Signed)
MOB was referred for history of depression/anxiety.  Referral is screened out by Clinical Social Worker because none of the following criteria appear to apply: -History of anxiety/depression during this pregnancy, or of post-partum depression. - Diagnosis of anxiety and/or depression within last 3 years (onset in 2003, with no documented concern during this pregnancy) or -MOB's symptoms are currently being treated with medication and/or therapy.  Please contact the Clinical Social Worker if needs arise or upon MOB request.

## 2015-12-27 NOTE — Lactation Note (Signed)
This note was copied from a baby's chart. Lactation Consultation Note  Patient Name: Katie Dyer [id number]'s Date: 12/27/2015 Reason for consult: Follow-up assessment RN requested help with difficult latch. Mom has large breast with a large short shaft nipple. Applied #24 NS and baby was able to take in the entire girth of the nipple. Mom reported that latch felt better. Encouraged mom to post pump after using the NS and try to wean from it as soon as she can. She is aware of OP services and support group. She will call for help as needed.    Maternal Data    Feeding Feeding Type: Breast Fed  LATCH Score/Interventions                      Lactation Tools Discussed/Used Tools: Nipple Shields Nipple shield size: 24   Consult Status Consult Status: Follow-up Date: 12/28/15 Follow-up type: In-patient    Katie Dyer 12/27/2015, 11:00 PM

## 2015-12-27 NOTE — Lactation Note (Signed)
This note was copied from a baby's chart. Lactation Consultation Note  Baby recently circumcised and sleepy. Unwrapped baby and reviewed hand expression w/ mother. Attempted latching in football hold but baby did not wake with stimulation. Suggest mother call when baby cues to assist w/ latch. Mom encouraged to feed baby 8-12 times/24 hours and with feeding cues.  Put baby STS on mother's chest.    Patient Name: Katie Dyer Reason for consult: Follow-up assessment   Maternal Data    Feeding    LATCH Score/Interventions                      Lactation Tools Discussed/Used     Consult Status Consult Status: Follow-up Date: 12/28/15 Follow-up type: In-patient    [name], Noemy Hallmon [facility] HospitalBoschen Dyer, 11:11 AM

## 2015-12-27 NOTE — Lactation Note (Signed)
This note was copied from a baby's chart. Lactation Consultation Note New mom, baby sleeping soundly. LC needs to see latch. RN and mom states baby is latching well. Mom has LARGE nipples w/some edema around/behind areola. Reverse pressure done noted to be helpful. Encouraged mom to do that and roll nipple in finger tips to evert nipples and make more compressible. Mom has very large pendulum breast. Encouraged to roll cloth under breast for elevation. Hand expression noted colostrum. Mom wanted DEBP and RN set that up for mom. Mom states she doesn't have anything coming out when pumping, explained that is normal. Reviewed basics of BF, and discussed positioning and obtaining deep latches. Newborn behavior and feeding patterns reviewed. WH/LC brochure given w/resources, support groups and LC services. Patient Name: Katie Valaria GoodOnyeka Dyer QMVHQ'IToday's Date: 12/27/2015 Reason for consult: Initial assessment   Maternal Data Has patient been taught Hand Expression?: Yes Does the patient have breastfeeding experience prior to this delivery?: No  Feeding Feeding Type: Breast Fed Length of feed: 15 min  LATCH Score/Interventions    Intervention(s): Skin to skin;Hand expression;Alternate breast massage  Type of Nipple: Everted at rest and after stimulation  Comfort (Breast/Nipple): Soft / non-tender     Intervention(s): Breastfeeding basics reviewed;Support Pillows;Position options;Skin to skin     Lactation Tools Discussed/Used Tools: Pump Breast pump type: Double-Electric Breast Pump   Consult Status Consult Status: Follow-up Date: 12/27/15 (in pm) Follow-up type: In-patient    Charyl DancerCARVER, Becker Christopher G 12/27/2015, 5:28 AM

## 2015-12-27 NOTE — Progress Notes (Signed)
POSTOPERATIVE DAY # 1 S/P CS  S:         Reports feeling ok - really itchy             Tolerating po intake / some nausea early this am / no vomiting / no flatus / no BM             Bleeding is light             Pain controlled with Motrin             Not OOB yet  Newborn breast feeding    O:  VS: BP 120/68 mmHg  Pulse 106  Temp(Src) 99 F (37.2 C) (Oral)  Resp 16  Ht 5\' 7"  (1.702 m)  Wt 100.699 kg (222 lb)  BMI 34.76 kg/m2  SpO2 98%  LMP 09/08/2014  Breastfeeding? Unknown   LABS:               Recent Labs  12/26/15 2028 12/27/15 0505  WBC 11.5* 9.5  HGB 10.4* 9.1*  PLT 315 290               Bloodtype: --/--/O POS, O POS (03/19 1755)  Rubella: Immune (08/15 0000)                                             I&O: Intake/Output      03/19 0701 - 03/20 0700 03/20 0701 - 03/21 0700   I.V. (mL/kg) 750 (7.4) 937.5 (9.3)   Total Intake(mL/kg) 750 (7.4) 937.5 (9.3)   Urine (mL/kg/hr) 550 75 (0.2)   Emesis/NG output 0    Blood 800    Total Output 1350 75   Net -600 +862.5        Emesis Occurrence 1 x                 Physical Exam:             Alert and Oriented X3  Lungs: Clear and unlabored  Heart: regular rate and rhythm / no mumurs  Abdomen: soft, non-tender, non-distended, hypoactive BS             Fundus: firm, non-tender, Ueven             Dressing pressure dressing intact              Perineum: intact / foley draining dark yellow urine  Lochia: light  Extremities: 1+ edema, no calf pain or tenderness, SCD in place  A:        POD # 1 S/P CS            ABL anemia  P:        Routine postoperative care             Medication for itching today prn / advance activity                  Marlinda MikeBAILEY, Bekah Igoe CNM, MSN, FACNM 12/27/2015, 11:10 AM

## 2015-12-27 NOTE — Addendum Note (Signed)
Addendum  created 12/27/15 0800 by Elgie CongoNataliya H Eduarda Scrivens, CRNA   Modules edited: Clinical Notes   Clinical Notes:  File: [id number]

## 2015-12-27 NOTE — Anesthesia Postprocedure Evaluation (Signed)
Anesthesia Post Note  Patient: Katie Dyer  Procedure(s) Performed: Procedure(s) (LRB): CESAREAN SECTION (N/A)  Patient location during evaluation: Mother Baby Anesthesia Type: Spinal Level of consciousness: awake, awake and alert and oriented Pain management: pain level controlled Vital Signs Assessment: post-procedure vital signs reviewed and stable Respiratory status: spontaneous breathing Cardiovascular status: stable Postop Assessment: patient able to bend at knees, no signs of nausea or vomiting and adequate PO intake Anesthetic complications: no    Last Vitals:  Filed Vitals:   12/27/15 0020 12/27/15 0347  BP: 133/76   Pulse: 100   Temp: 36.9 C 37.2 C  Resp: 18 18    Last Pain:  Filed Vitals:   12/27/15 0647  PainSc: 0-No pain                 Taija Mathias Hristova

## 2015-12-28 ENCOUNTER — Encounter (HOSPITAL_COMMUNITY): Payer: Self-pay | Admitting: Obstetrics and Gynecology

## 2015-12-28 DIAGNOSIS — D62 Acute posthemorrhagic anemia: Secondary | ICD-10-CM | POA: Diagnosis not present

## 2015-12-28 DIAGNOSIS — IMO0001 Reserved for inherently not codable concepts without codable children: Secondary | ICD-10-CM

## 2015-12-28 HISTORY — DX: Reserved for inherently not codable concepts without codable children: IMO0001

## 2015-12-28 HISTORY — DX: Acute posthemorrhagic anemia: D62

## 2015-12-28 MED ORDER — POLYSACCHARIDE IRON COMPLEX 150 MG PO CAPS
150.0000 mg | ORAL_CAPSULE | Freq: Every day | ORAL | Status: DC
  Administered 2015-12-28 – 2015-12-29 (×2): 150 mg via ORAL
  Filled 2015-12-28 (×2): qty 1

## 2015-12-28 MED ORDER — MAGNESIUM OXIDE 400 (241.3 MG) MG PO TABS
200.0000 mg | ORAL_TABLET | Freq: Every day | ORAL | Status: DC
  Administered 2015-12-28 – 2015-12-29 (×2): 200 mg via ORAL
  Filled 2015-12-28 (×3): qty 0.5

## 2015-12-28 NOTE — Lactation Note (Signed)
This note was copied from a baby's chart. Lactation Consultation Note  Patient Name: Katie Dyer ZOXWR'UToday's Date: 12/28/2015 Reason for consult: Follow-up assessment;Difficult latch (Left nipple large and tough. )  Baby 43 hours old. Mom reports that she is having trouble latching baby to left breast; however, baby latched to right breast well. Assisted mom to attempt to latch baby to left breast first, but baby very frustrated and not able to latch. Assisted with latching baby to right breast, and baby latched deeply and suckled rhythmically with a few swallows noted. Baby nursed well for 20 minutes, but then fell asleep. Offered to assist with latching baby to left breast, but mom declined stating that she would prefer to pump and bottle-feed EBM. Mom has a pump at bedside that was set up for her to pump. However, mom has not pumped yet. Offered to assist mom with pumping, but mom declined. Mom stated that she knows how to use pump and will pump after baby fed. Mom requested formula and it was given as mom had already given a bottle earlier. Mom given supplementation guidelines, and EBM storage guidelines reviewed.   Plan is for mom to put baby to breast with cues, and then supplement with EBM/formula. Enc mom to post pump after each feeding. Discussed supply and demand and normal progression of milk coming to volume. Mom is able to express colostrum from right breast. Enc mom to pump both breast simultaneously for 15 minutes followed by hand expression.   Discussed assessment, interventions and feeding plan with patient's bedside nurse, Herbert SetaHeather, RN.  Maternal Data    Feeding Feeding Type: Breast Fed Length of feed: 20 min  LATCH Score/Interventions Latch: Grasps breast easily, tongue down, lips flanged, rhythmical sucking. Intervention(s): Adjust position;Assist with latch;Breast compression  Audible Swallowing: A few with stimulation Intervention(s): Skin to skin;Hand  expression  Type of Nipple: Everted at rest and after stimulation (Left nipple flat, right nipple short shaft. ) Intervention(s): Double electric pump  Comfort (Breast/Nipple): Soft / non-tender     Hold (Positioning): Assistance needed to correctly position infant at breast and maintain latch. Intervention(s): Breastfeeding basics reviewed;Support Pillows;Position options;Skin to skin  LATCH Score: 8  Lactation Tools Discussed/Used     Consult Status Consult Status: Follow-up Date: 12/29/15 Follow-up type: In-patient    Geralynn OchsWILLIARD, Lindie Roberson 12/28/2015, 2:59 PM

## 2015-12-28 NOTE — Addendum Note (Signed)
Addendum  created 12/28/15 1934 by Rica RecordsAngela Lougenia Morrissey, CRNA   Modules edited: Clinical Notes   Clinical Notes:  File: [id number]

## 2015-12-28 NOTE — Anesthesia Postprocedure Evaluation (Signed)
Anesthesia Post Note  Patient: Katie Dyer  Procedure(s) Performed: Procedure(s) (LRB): CESAREAN SECTION (N/A)  Patient location during evaluation: Mother Baby Anesthesia Type: Epidural Level of consciousness: awake and alert Pain management: pain level controlled Vital Signs Assessment: post-procedure vital signs reviewed and stable Respiratory status: spontaneous breathing, nonlabored ventilation and respiratory function stable Cardiovascular status: stable Postop Assessment: no headache, no backache and epidural receding Anesthetic complications: no    Last Vitals:  Filed Vitals:   12/28/15 1531 12/28/15 1826  BP: 142/80 132/76  Pulse: 100 100  Temp: 37.4 C 37.1 C  Resp: 18 18    Last Pain:  Filed Vitals:   12/28/15 1827  PainSc: 5                  Yenesis Even

## 2015-12-28 NOTE — Progress Notes (Signed)
Patient ID: Katie Dyer, female   DOB: 1985/12/15, 30 y.o.   MRN: [id number] Subjective: S/P Primary Cesarean Delivery for Arrest of Dilation POD# 2 Information for the patient's newborn:  Arlice ColtZeigler, Boy Kaeya [[id number]][[id number]]  female   / circ done  Reports feeling better than yesterday. Feeding: breast Patient reports tolerating PO.  Breast symptoms: LT nipple fissure - working with LC today Pain controlled with ibuprofen (OTC) and narcotic analgesics including Percocet Denies HA/SOB/C/P/N/V/dizziness. Flatus present. No BM. She reports vaginal bleeding as normal, without clots.  She is ambulating, urinating without difficult.     Objective:   VS:  Filed Vitals:   12/27/15 1217 12/27/15 1620 12/27/15 1844 12/28/15 0500  BP: 125/75 120/74 130/66 132/72  Pulse: 100 88 92 92  Temp: 99.1 F (37.3 C) 98.4 F (36.9 C) 98.6 F (37 C) 97.6 F (36.4 C)  TempSrc: Oral Oral Oral Oral  Resp: 18 20 18 18   Height:      Weight:      SpO2: 92% 97%        Intake/Output Summary (Last 24 hours) at 12/28/15 [id number] Last data filed at 12/27/15 2255  Gross per 24 hour  Intake    720 ml  Output   1000 ml  Net   -280 ml         Recent Labs  12/26/15 2028 12/27/15 0505  WBC 11.5* 9.5  HGB 10.4* 9.1*  HCT 30.7* 26.7*  PLT 315 290     Blood type: O POS (03/19 1755)  Rubella: Immune (08/15 0000)     Physical Exam:   General: alert, cooperative, fatigued, no distress and mildly obese  CV: Regular rate and rhythm, S1S2 present or without murmur or extra heart sounds  Resp: clear  Abdomen: soft, nontender, normal bowel sounds  Incision: C/D/I - partially covered by pressure dressing, but able to fully visualize  Uterine Fundus: firm, 1 FB below umbilicus, nontender  Lochia: minimal  Ext: extremities normal, atraumatic, no cyanosis or edema, Homans sign is negative, no sign of DVT and no edema, redness or tenderness in the calves or thighs   Assessment/Plan: 30 y.o.   POD# 2.  S/P  Cesarean Delivery.  Indications: arrest of dilation                Principal Problem:   Postpartum care following cesarean delivery (3/19) Active Problems:   Mild preeclampsia   Maternal iron deficiency anemia   Postoperative anemia due to acute blood loss  Doing well, stable.               Regular diet as tolerated Ambulate TID in hallway today Start Niferex 150 mg po and Magnesium Oxide 200 mg po daily Shower and remove pressure dressing Routine post-op care Anticipate D/C home tomorrow  Raelyn MoraAWSON, Aditya Nastasi, M, MSN, CNM 12/28/2015, 9:18 AM

## 2015-12-29 MED ORDER — IBUPROFEN 600 MG PO TABS: 600.0000 mg | ORAL_TABLET | Freq: Four times a day (QID) | ORAL | Status: DC

## 2015-12-29 MED ORDER — MAGNESIUM OXIDE 400 (241.3 MG) MG PO TABS: 400.0000 mg | ORAL_TABLET | Freq: Every day | ORAL | Status: DC

## 2015-12-29 MED ORDER — POLYSACCHARIDE IRON COMPLEX 150 MG PO CAPS: 150.0000 mg | ORAL_CAPSULE | Freq: Every day | ORAL | Status: DC

## 2015-12-29 NOTE — Progress Notes (Signed)
Patient states she had influenza and tdap vaccines.

## 2015-12-29 NOTE — Lactation Note (Signed)
This note was copied from a baby's chart. Lactation Consultation Note  Mother has questions regarding expressing her milk. She is concerned that her milk may not come to volume. Her breasts are heavy and small amounts of colostrum can be expressed. Encouragement and anticipatory guidance given. Support groups also recommended.  She is aware that she can call lactation with any concerns. Patient Name: Katie Dyer: 12/29/2015 Reason for consult: Follow-up assessment   Maternal Data    Feeding Feeding Type: Formula Nipple Type: Slow - flow  LATCH Score/Interventions                      Lactation Tools Discussed/Used Tools: Pump;Flanges Flange Size: Other (comment) (30 right, 36 left)   Consult Status Consult Status: Complete    Katie Dyer, Katie Dyer 12/29/2015, 9:20 AM

## 2015-12-29 NOTE — Progress Notes (Signed)
POSTOPERATIVE DAY # 3 S/P CS  S:         Reports feeling well             Tolerating po intake / no nausea / no vomiting / + flatus / no BM             Bleeding is light             Pain controlled with motrin             Up ad lib / ambulatory/ voiding QS  Newborn breast feeding    O:  VS: BP 139/82 mmHg  Pulse 103  Temp(Src) 98.2 F (36.8 C) (Oral)  Resp 18  Ht 5\' 7"  (1.702 m)  Wt 100.699 kg (222 lb)  BMI 34.76 kg/m2  SpO2 98%  LMP 09/08/2014  Breastfeeding? Unknown   LABS:               Recent Labs  12/26/15 2028 12/27/15 0505  WBC 11.5* 9.5  HGB 10.4* 9.1*  PLT 315 290               Bloodtype: --/--/O POS, O POS (03/19 1755)  Rubella: Immune (08/15 0000)                                    Physical Exam:             Alert and Oriented X3  Abdomen: soft, non-tender, non-distended              Fundus: firm, non-tender, Ueven             Dressing intact honeycomb              Incision:  approximated with suture / no erythema / no ecchymosis / no drainage  Perineum: intact  Lochia: light  Extremities: trace edema, no calf pain or tenderness, negative Homans  A:        POD # 3 S/P CS            Mild ABL anemia  P:        Routine postoperative care              DC home - [facility] booklet -instructions reviewed   Katie Dyer, Katie Dyer CNM, MSN, FACNM 12/29/2015, 4:45 PM

## 2015-12-29 NOTE — Discharge Summary (Signed)
OB Discharge Summary  Patient Name: Katie Dyer DOB: 1986/08/23 MRN: [id number]  Date of admission: 12/26/2015  Admitting diagnosis: elevated blood pressure Intrauterine pregnancy: 9050w6d     Secondary diagnosis: Preeclampsia   Date of discharge: 12/29/2015     Discharge diagnosis: Term Pregnancy Delivered      Prenatal history: G1P1001   EDC : 01/03/2016, Alternate EDD Entry  [facility]  Primary provider : Seymour BarsLavoie Prenatal course uncomplicated   Prenatal Labs: ABO, Rh: --/--/O POS, O POS (03/19 1755)  Antibody: NEG (03/19 1755) Rubella: Immune (08/15 0000)   RPR: Non Reactive (03/19 0952)  HBsAg: Negative (08/15 0000)  HIV: Non-reactive (08/15 0000)  GBS: Positive (02/20 0000)                                    Hospital course:  Onset of Labor With Unplanned C/S  [age] y.o. yo G1P1001 at 2450w6d was admitted for IOL due to preeclampsia and elevated blood pressure on 12/26/2015. Patient had a labor course significant for active phase arrest with arrest of dilation at 4cm. Membrane Rupture Time/Date: 4:25 PM ,12/26/2015   The patient went for cesarean section due to Arrest of Dilation, and delivered a Viable infant,12/26/2015  Details of operation can be found in separate operative note. Patient had an uncomplicated postpartum course.  She is ambulating,tolerating a regular diet, passing flatus, and urinating well.  Patient is discharged home in stable condition 12/29/2015. Augmentation: Pitocin Delivering PROVIDER: Olivia MackieAAVON, RICHARD                                                            Complications: None  Newborn Data: Live born female  Birth Weight: 6 lb 11.8 oz (3055 g) APGAR: 8, 9  Baby Feeding: Breast Disposition:home with mother  Post partum procedures:none  Postpartum contraception: Not Discussed    Labs: Lab Results  Component Value Date   WBC 9.5 12/27/2015   HGB 9.1* 12/27/2015   HCT 26.7* 12/27/2015   MCV  87.3 12/27/2015   PLT 290 12/27/2015   CMP Latest Ref Rng 12/26/2015  Glucose 65 - 99 mg/dL 80  BUN 6 - 20 mg/dL 7  Creatinine 4.090.44 - 8.111.00 mg/dL 9.140.53  Sodium 782135 - 956145 mmol/L 138  Potassium 3.5 - 5.1 mmol/L 3.5  Chloride 101 - 111 mmol/L 107  CO2 22 - 32 mmol/L 23  Calcium 8.9 - 10.3 mg/dL 8.2(L)  Total Protein 6.5 - 8.1 g/dL 6.0(L)  Total Bilirubin 0.3 - 1.2 mg/dL 0.4  Alkaline Phos 38 - 126 U/L 128(H)  AST 15 - 41 U/L 18  ALT 14 - 54 U/L 14    Physical Exam @ time of discharge:  Filed Vitals:   12/28/15 0500 12/28/15 1531 12/28/15 1826 12/29/15 0505  BP: 132/72 142/80 132/76 139/82  Pulse: 92 100 100 103  Temp: 97.6 F (36.4 C) 99.4 F (37.4 C) 98.7 F (37.1 C) 98.2 F (36.8 C)  TempSrc: Oral Oral Oral Oral  Resp: 18 18 18 18   Height:      Weight:      SpO2:  98%      General: alert, cooperative and no  distress Lochia: appropriate Uterine Fundus: firm Incision: Healing well with no significant drainage Extremities:mild edema DVT Evaluation: No evidence of DVT seen on physical exam.   Discharge instructions:  "Baby and Me Booklet" and [facility] Booklet  Discharge Medications:    Medication List    TAKE these medications        acetaminophen 500 MG tablet  Commonly known as:  TYLENOL  Take 500 mg by mouth every 6 (six) hours as needed for moderate pain.     albuterol 108 (90 Base) MCG/ACT inhaler  Commonly known as:  PROVENTIL HFA;VENTOLIN HFA  Inhale 1-2 puffs into the lungs every 6 (six) hours as needed for wheezing or shortness of breath.     cetirizine 10 MG tablet  Commonly known as:  ZYRTEC  Take 10 mg by mouth daily.     ibuprofen 600 MG tablet  Commonly known as:  ADVIL,MOTRIN  Take 1 tablet (600 mg total) by mouth every 6 (six) hours.     iron polysaccharides 150 MG capsule  Commonly known as:  NIFEREX  Take 1 capsule (150 mg total) by mouth daily.     magnesium oxide 400 (241.3 Mg) MG tablet  Commonly known as:  MAG-OX  Take 1 tablet  (400 mg total) by mouth daily.     multivitamin-prenatal 27-0.8 MG Tabs tablet  Take 1 tablet by mouth daily at 12 noon.        Diet: routine diet  Activity: Advance as tolerated. Pelvic rest x 6 weeks.   Follow up:2 weeks    Signed: [name] CNM, MSN, [facility] 12/29/2015, 4:48 PM

## 2016-03-23 ENCOUNTER — Ambulatory Visit (INDEPENDENT_AMBULATORY_CARE_PROVIDER_SITE_OTHER): Payer: Self-pay | Admitting: Family Medicine

## 2016-03-23 ENCOUNTER — Encounter: Payer: Self-pay | Admitting: Gastroenterology

## 2016-03-23 ENCOUNTER — Encounter: Payer: Self-pay | Admitting: Family Medicine

## 2016-03-23 VITALS — BP 126/82 | HR 95 | Temp 98.0°F | Wt 229.0 lb

## 2016-03-23 DIAGNOSIS — K625 Hemorrhage of anus and rectum: Secondary | ICD-10-CM

## 2016-03-23 LAB — POCT HEMOGLOBIN: Hemoglobin: 13.5 g/dL (ref 12.2–16.2)

## 2016-03-23 NOTE — Progress Notes (Signed)
Date of Visit: 03/23/2016   HPI:  Patient presents for a same day appointment to discuss rectal bleeding.   Reports for the last 4-5 days has had blood on toilet paper when she wipes after stooling. No constipation or straining. No abdominal or rectal pain. Does not feel shortness of breath or dizzy. She is currently breastfeeding. Not on anything for birth control presently. She is currently breastfeeding.  Reports history of colonic polyps removed at age 30. These were identified after she presented similarly with rectal bleeding. She then had colonoscopy done in March 2009 for hematochezia & history of juvenile colon polyps. Exam was normal, recommended follow up exam at age 30. Does not follow regularly with GI.   Recently delivered a baby on 12/26/15 via cesarean section due to failure to progress and arrest of dilation after IOL for mild pre-eclampsia.  ROS: See HPI  PMFSH: history of depression, allergic rhinitis, iron deficiency anemia  PHYSICAL EXAM: BP 126/82 mmHg  Pulse 95  Temp(Src) 98 F (36.7 C) (Oral)  Wt 229 lb (103.874 kg)  Breastfeeding? Yes Gen: NAD, pleasant, cooperative HEENT: normocephalic, atraumatic, mmm Heart:  Regular rate and rhythm no murmur Lungs: clear to auscultation bilaterally, normal work of breathing  Abdomen: soft nontender to palpation, no masses, no organomegaly, normoactive bowel sounds  Rectal: small non-irritated, non-bleeding external hemorrhoid midline. Normal anoscopy, no lesions or signs of bleeding. Normal digital rectal exam (good tone, no masses, no gross blood). Neuro: alert, grossly nonfocal, speech normal  ASSESSMENT/PLAN:  Rectal bleeding Normal rectal exam today. Hgb normal. Hemodynamically stable. With her history of resection of colon polyps as a child, warrants further evaluation. Will refer to GI for likely colonoscopy. Return precautions discussed, see after visit summary. Patient agreeable with this plan.    FOLLOW  UP: Referring to GI for further evaluation.  GrenadaBrittany J. Pollie MeyerMcIntyre, MD [facility]

## 2016-03-23 NOTE — Patient Instructions (Signed)
I am referring you to a GI doctor for your rectal bleeding. You will get a phone call to schedule this appointment.   If you have larger volumes of blood, feel shortness of breath or lightheadedness please call us or seek care in the ER.  Be well, Dr. Pollie MeyerMcIntyre

## 2016-03-24 DIAGNOSIS — K625 Hemorrhage of anus and rectum: Secondary | ICD-10-CM | POA: Insufficient documentation

## 2016-03-24 NOTE — Assessment & Plan Note (Signed)
Normal rectal exam today. Hgb normal. Hemodynamically stable. With her history of resection of colon polyps as a child, warrants further evaluation. Will refer to GI for likely colonoscopy. Return precautions discussed, see after visit summary. Patient agreeable with this plan.

## 2016-05-24 ENCOUNTER — Ambulatory Visit: Payer: BLUE CROSS/BLUE SHIELD | Admitting: Gastroenterology

## 2016-06-10 ENCOUNTER — Ambulatory Visit (HOSPITAL_COMMUNITY)
Admission: EM | Admit: 2016-06-10 | Discharge: 2016-06-10 | Disposition: A | Discharge status: Home / Self Care | Payer: BLUE CROSS/BLUE SHIELD | Attending: Family | Admitting: Family

## 2016-06-10 ENCOUNTER — Encounter (HOSPITAL_COMMUNITY): Payer: Self-pay | Admitting: Emergency Medicine

## 2016-06-10 DIAGNOSIS — S63502A Unspecified sprain of left wrist, initial encounter: Secondary | ICD-10-CM | POA: Diagnosis not present

## 2016-06-10 MED ORDER — NAPROXEN 500 MG PO TABS: 500.0000 mg | ORAL_TABLET | Freq: Two times a day (BID) | ORAL | 0 refills | Status: DC | PRN

## 2016-06-10 NOTE — ED Triage Notes (Signed)
Pt c/o left wrist pain onset 8/13... Reports pain increases w/activity    Denies inj/trauma but will have occasional tingly of left hand  A&O x4... NAD

## 2016-06-10 NOTE — ED Provider Notes (Signed)
CSN: [id number]     Arrival date & time 06/10/16  1157 History   First MD Initiated Contact with Patient 06/10/16 1251     Chief Complaint  Patient presents with  . Wrist Pain   (Consider location/radiation/quality/duration/timing/severity/associated sxs/prior Treatment) 30 year old female presents with left wrist pain for about 3 weeks. No distinct trauma. Has noticed some swelling and pain from the base of her thumb up to mid radius. Activity makes it worse. Has been trying to take Tylenol with minimal relief. Similar symptoms occurred about 6-9 months ago with right wrist. Saw an Orthopedic and had a cortisone shot which helped. She is trying to wear the right wrist splint backwards for the left wrist with minimal success. Also trying to get pregnant so requests minimal medication.       Past Medical History:  Diagnosis Date  . Asthma   . Depression 2003  . Insomnia 2010  . Intestinal polyps   . Maternal iron deficiency anemia 12/28/2015  . Menorrhagia 09/19/2011  . MENSTRUATION, PAINFUL 12/06/2006   Qualifier: Diagnosis of  By: [name], [name]    . Postoperative anemia due to acute blood loss 12/28/2015  . Postpartum care following cesarean delivery (3/19) 12/27/2015  . RECTAL POLYPS 07/25/2007   Qualifier: Diagnosis of  By: [name], [name]     Past Surgical History:  Procedure Laterality Date  . CERVICAL POLYPECTOMY    . CESAREAN SECTION N/A 12/26/2015   Procedure: CESAREAN SECTION;  Surgeon: [name], MD;  Location: [facility];  Service: Obstetrics;  Laterality: N/A;  . COLONOSCOPY    . TONSILLECTOMY     Family History  Problem Relation Age of Onset  . Bipolar disorder Mother   . Hypertension Mother   . Hypertension Other   . Depression Maternal Aunt   . Hypothyroidism Maternal Aunt   . Cancer Maternal Aunt     breast cancer in multiple aunts  . Depression Maternal Uncle   . Depression Maternal Grandmother   . Hyperthyroidism Maternal Grandmother   . Sjogren's  syndrome Maternal Grandmother   . Asthma Maternal Grandmother   . Diabetes Maternal Grandfather   . Hypertension Maternal Grandfather   . Cancer Maternal Grandfather     pancreatic   . Anesthesia problems Neg Hx    Social History  Substance Use Topics  . Smoking status: Never Smoker  . Smokeless tobacco: Never Used  . Alcohol use Yes     Comment: ocassionally   OB History    Gravida Para Term Preterm AB Living   1 1 1  0 0 1   SAB TAB Ectopic Multiple Live Births   0 0 0 0 1     Review of Systems  Constitutional: Negative for chills and fever.  Musculoskeletal: Positive for joint swelling and myalgias.  Skin: Negative.   Neurological: Positive for numbness. Negative for dizziness, tremors, weakness and headaches.    Allergies  Review of patient's allergies indicates no known allergies.  Home Medications   Prior to Admission medications   Medication Sig Start Date End Date Taking? Authorizing Provider  acetaminophen (TYLENOL) 500 MG tablet Take 500 mg by mouth every 6 (six) hours as needed for moderate pain.    Historical Provider, MD  albuterol (PROVENTIL HFA;VENTOLIN HFA) 108 (90 BASE) MCG/ACT inhaler Inhale 1-2 puffs into the lungs every 6 (six) hours as needed for wheezing or shortness of breath. 07/22/15   Latrelle DodrillBrittany J McIntyre, MD  cetirizine (ZYRTEC) 10 MG tablet Take 10 mg  by mouth daily.    Historical Provider, MD  iron polysaccharides (NIFEREX) 150 MG capsule Take 1 capsule (150 mg total) by mouth daily. 12/29/15   [name], CNM  magnesium oxide (MAG-OX) 400 (241.3 Mg) MG tablet Take 1 tablet (400 mg total) by mouth daily. 12/29/15   [name], CNM  naproxen (NAPROSYN) 500 MG tablet Take 1 tablet (500 mg total) by mouth 2 (two) times daily as needed for moderate pain. 06/10/16   [name], NP  Prenatal Vit-Fe Fumarate-FA (MULTIVITAMIN-PRENATAL) 27-0.8 MG TABS tablet Take 1 tablet by mouth daily at 12 noon.    Historical Provider, MD   Meds Ordered and  Administered this Visit  Medications - No data to display  BP 127/70 (BP Location: Left Arm)   Pulse 76   Temp 98.1 F (36.7 C) (Oral)   Resp 18   SpO2 99%  No data found.   Physical Exam  Constitutional: She is oriented to person, place, and time. She appears well-developed and well-nourished. No distress.  Cardiovascular: Normal rate and regular rhythm.   Musculoskeletal: She exhibits tenderness.       Left hand: She exhibits decreased range of motion, tenderness and swelling. She exhibits normal capillary refill and no deformity. Normal sensation noted. Normal strength noted.       Hands: Slight swelling and tenderness along the radial nerve from base of thumb to mid-way up radial bone. Has decreased range of motion- particularly with flexion. No bruising or redness seen. Good pulses and capillary refill. No sensory deficits.    Neurological: She is alert and oriented to person, place, and time. She has normal strength. No sensory deficit. She exhibits normal muscle tone.  Skin: Skin is warm and dry. Capillary refill takes less than 2 seconds.  Psychiatric: She has a normal mood and affect. Her behavior is normal. Judgment and thought content normal.    Urgent Care Course   Clinical Course    Procedures (including critical care time)  Labs Review Labs Reviewed - No data to display  Imaging Review No results found.   Visual Acuity Review  Right Eye Distance:   Left Eye Distance:   Bilateral Distance:    Right Eye Near:   Left Eye Near:    Bilateral Near:         MDM   1. Left wrist sprain, initial encounter    Recommend Naproxen twice a day as needed for pain and swelling. Provided proper left wrist splint for comfort. May need to see Orthopedic if pain and swelling do not improve. Follow-up with her primary care provider in 4 to 5 days if not improving.     [name], NP 06/11/16 [phone]

## 2016-06-10 NOTE — Discharge Instructions (Signed)
Start Naproxen twice a day as needed for pain and swelling. Wear wrist splint when working for support and comfort. Follow-up with your Orthopedic if symptoms do not improve within 1-2 weeks.

## 2016-06-26 ENCOUNTER — Ambulatory Visit (INDEPENDENT_AMBULATORY_CARE_PROVIDER_SITE_OTHER): Payer: BLUE CROSS/BLUE SHIELD | Admitting: Sports Medicine

## 2016-06-26 ENCOUNTER — Encounter: Payer: Self-pay | Admitting: Sports Medicine

## 2016-06-26 VITALS — BP 125/81 | HR 84 | Ht 67.0 in | Wt 215.0 lb

## 2016-06-26 DIAGNOSIS — M654 Radial styloid tenosynovitis [de Quervain]: Secondary | ICD-10-CM

## 2016-06-26 MED ORDER — METHYLPREDNISOLONE ACETATE 40 MG/ML IJ SUSP
20.0000 mg | Freq: Once | INTRAMUSCULAR | Status: AC
  Administered 2016-06-26: 20 mg via INTRA_ARTICULAR

## 2016-06-26 NOTE — Progress Notes (Signed)
   Subjective:  Katie Dyer is a 30 y.o. female who presents to the [facility] today with a chief complaint of wrist pain.   HPI:  Left Wrist Pain Started about 4-5 weeks ago. No trauma. Has tried tylenol without significant relief. Located at the base of her thumb. Had similar pain in the past in her right hand and received a steroid injection which helped with the pain. Was seen at Urgent Care 2 weeks ago for this and given a wrist splint. She was also started on naproxen at that time.  Since then her symptoms have continued to worsen. She has not noticed anything that make the pain better. Her pain is worse with movement and flexing her wrist. She has noticed a little bit of swelling along the past of her thumb. 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   ROS: Per HPI  Objective:  Physical Exam: There were no vitals taken for this visit.  Gen: NAD, resting comfortably Pulm: NWOB MSK: - Left Hand: No deformities. Tender to palpation over radial styloid. Strength 5/5 at wrist. FROM. Positive Finkelstein. - Right hand: No deformities. Nontender to palpation. Strength 5/5. FROM. 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 Steroid injection performed today. Instructed patient to continue wearing wrist brace. Also discussed ways to avoid exacerbations in the future, including modifying the way she lifts her infant and other children at work. Return precautions reviewed. Follow up as needed.   Katina Degreealeb M. Jimmey RalphParker, MD [facility] Resident PGY-3 06/26/2016 3:39 PM   Patient seen and evaluated with the resident. I agree with the above plan of care. Patient's presentation is consistent with DeQuervain tenosynovitis of the left wrist. Since she received excellent results with a previous injection into the right wrist we will inject her left wrist  today. Patient will continue wearing her brace for the next 7 days and may resume activity as tolerated after that. Follow-up for ongoing or recalcitrant issues.  Consent obtained and verified. Time-out conducted. Noted no overlying erythema, induration, or other signs of local infection. Skin prepped in a sterile fashion. Topical analgesic spray: Ethyl chloride. Joint: left wrist first extensor compartment Needle: 5/8 inch 25g Completed without difficulty. Meds: 0.5cc (20mg ) depomedrol, 0.5cc 1% xylocaine  Advised to call if fevers/chills, erythema, induration, drainage, or persistent bleeding.

## 2016-07-31 ENCOUNTER — Ambulatory Visit: Payer: BLUE CROSS/BLUE SHIELD | Admitting: Gastroenterology

## 2016-07-31 ENCOUNTER — Telehealth: Payer: Self-pay | Admitting: Gastroenterology

## 2016-07-31 NOTE — Telephone Encounter (Signed)
Yes, charge. 

## 2016-07-31 NOTE — Telephone Encounter (Signed)
Do you want to charge? 

## 2016-11-06 ENCOUNTER — Ambulatory Visit (INDEPENDENT_AMBULATORY_CARE_PROVIDER_SITE_OTHER): Payer: BLUE CROSS/BLUE SHIELD | Admitting: Sports Medicine

## 2016-11-06 ENCOUNTER — Encounter: Payer: Self-pay | Admitting: Sports Medicine

## 2016-11-06 VITALS — BP 131/92 | Ht 67.0 in | Wt 210.0 lb

## 2016-11-06 DIAGNOSIS — M25532 Pain in left wrist: Secondary | ICD-10-CM | POA: Diagnosis not present

## 2016-11-06 DIAGNOSIS — M654 Radial styloid tenosynovitis [de Quervain]: Secondary | ICD-10-CM

## 2016-11-06 MED ORDER — DICLOFENAC SODIUM 75 MG PO TBEC: DELAYED_RELEASE_TABLET | ORAL | 0 refills | Status: DC

## 2016-11-06 NOTE — Progress Notes (Signed)
   Subjective:    Patient ID: Katie Dyer, female    DOB: 07-02-1986, 31 y.o.   MRN: [id number]  HPI chief complaint: Left wrist pain  31 year old female comes in today with returning left wrist pain. She was last seen in the office back in September 2017 and diagnosed with DeQuervains tenosynovitis. Cortisone injection administered at that time was helpful. Pain has now returned. Primarily along the radial aspect of her thumb. Worse with radial and ulnar deviation. No recent trauma. No swelling.    Review of Systems    as above Objective:   Physical Exam  Well-developed, well-nourished. No acute distress  Left wrist: Full range of motion. Positive Finkelstein's. No swelling. No effusion. Neurovascularly intact distally.      Assessment & Plan:   Returning left wrist pain secondary to DeQuervains tenosynovitis  It's a little too soon to reinject her first extensor compartment. Instead, I would like to place her in a thumb spica brace to wear during the day for the next 3 weeks. We will also try Voltaren 75 mg twice daily with food. If symptoms persist a follow-up and we may need to consider referral to hand surgery.

## 2016-11-27 ENCOUNTER — Ambulatory Visit: Payer: BLUE CROSS/BLUE SHIELD | Admitting: Sports Medicine

## 2017-01-08 ENCOUNTER — Other Ambulatory Visit: Payer: Self-pay

## 2017-02-01 ENCOUNTER — Encounter (HOSPITAL_COMMUNITY): Payer: Self-pay | Admitting: Emergency Medicine

## 2017-02-01 ENCOUNTER — Emergency Department (HOSPITAL_COMMUNITY)
Admission: EM | Admit: 2017-02-01 | Discharge: 2017-02-01 | Disposition: A | Discharge status: Home / Self Care | Payer: BLUE CROSS/BLUE SHIELD | Attending: Emergency Medicine | Admitting: Emergency Medicine

## 2017-02-01 ENCOUNTER — Emergency Department (HOSPITAL_BASED_OUTPATIENT_CLINIC_OR_DEPARTMENT_OTHER): Payer: BLUE CROSS/BLUE SHIELD

## 2017-02-01 DIAGNOSIS — M79609 Pain in unspecified limb: Secondary | ICD-10-CM | POA: Diagnosis not present

## 2017-02-01 DIAGNOSIS — M7989 Other specified soft tissue disorders: Secondary | ICD-10-CM | POA: Diagnosis not present

## 2017-02-01 DIAGNOSIS — J45909 Unspecified asthma, uncomplicated: Secondary | ICD-10-CM | POA: Insufficient documentation

## 2017-02-01 DIAGNOSIS — M79605 Pain in left leg: Secondary | ICD-10-CM

## 2017-02-01 DIAGNOSIS — Z79899 Other long term (current) drug therapy: Secondary | ICD-10-CM | POA: Diagnosis not present

## 2017-02-01 NOTE — Discharge Instructions (Signed)
I recommend taking  Ibuprofen every 6 hours as needed for pain relief. You may also try applying ice to affected area for 15-20 minutes 3-4 times daily for additional pain relief.  I recommend following up with your primary care doctor in the next 4-5 days if your symptoms have not improved. Please return to the Emergency Department if symptoms worsen or new onset of fever, chest pain, shortness of breath, worsening or spreading of redness, lower leg/cal swelling, worsening pain, numbness, weakness.

## 2017-02-01 NOTE — ED Provider Notes (Signed)
[facility] Provider Note   CSN: [id number] Arrival date & time: 02/01/17  0547     History   Chief Complaint Chief Complaint  Patient presents with  . Leg Pain    HPI Katie Dyer is a 31 y.o. female.  HPI   Patient is a 31 year old female with history of asthma who presents the ED with complaint of left lower leg pain, onset 5 days. Patient reports over the past week she started having pain to the posterior/medial aspect of her left lower leg and knee. She states yesterday she noticed a "red streak" over the area to the side/back of her knee. Patient denies taking any medications at home for her symptoms. Denies any recent fall, trauma or injury. Denies fever, chest pain, shortness of breath, swelling, numbness, weakness. Patient notes she was on Clomid for 5 days end of March but denies any other use of exogenous hormones. She also reports while she was on the medication she flew down to [place] for a trip. Patient denies history of blood clots but reports positive family history of blood clots. Denies history of cancer, recent hospitalizations or surgeries.   Past Medical History:  Diagnosis Date  . Asthma   . Depression 2003  . Insomnia 2010  . Intestinal polyps   . Maternal iron deficiency anemia 12/28/2015  . Menorrhagia 09/19/2011  . MENSTRUATION, PAINFUL 12/06/2006   Qualifier: Diagnosis of  By: Levada Schilling    . Postoperative anemia due to acute blood loss 12/28/2015  . Postpartum care following cesarean delivery (3/19) 12/27/2015  . RECTAL POLYPS 07/25/2007   Qualifier: Diagnosis of  By: Constance Goltz MD, Matthew      Patient Active Problem List   Diagnosis Date Noted  . Left wrist pain 11/06/2016  . Rectal bleeding 03/24/2016  . Maternal iron deficiency anemia 12/28/2015  . Postoperative anemia due to acute blood loss 12/28/2015  . Postpartum care following cesarean delivery (3/19) 12/27/2015  . Mild preeclampsia 12/26/2015  . Dysmenorrhea 10/07/2014  .  Acute pharyngitis 10/28/2013  . Back pain with radiation 06/19/2013  . Axillary lymphadenopathy 05/06/2013  . Eczema 09/16/2012  . Skin tag 09/16/2012  . Rash of face 09/16/2012  . De Quervain's tenosynovitis, right 09/16/2012  . Healthcare maintenance 06/07/2012  . DEPRESSION 03/14/2010  . ALLERGIC RHINITIS, SEASONAL 01/13/2010  . Overweight(278.02) 12/06/2006  . ASTHMA, PERSISTENT 12/06/2006    Past Surgical History:  Procedure Laterality Date  . CERVICAL POLYPECTOMY    . CESAREAN SECTION N/A 12/26/2015   Procedure: CESAREAN SECTION;  Surgeon: Olivia Mackie, MD;  Location: [facility];  Service: Obstetrics;  Laterality: N/A;  . COLONOSCOPY    . TONSILLECTOMY      OB History    Gravida Para Term Preterm AB Living   0 0 1   SAB TAB Ectopic Multiple Live Births   0 0 0 0 1       Home Medications    Prior to Admission medications   Medication Sig Start Date End Date Taking? Authorizing Provider  acetaminophen (TYLENOL) 500 MG tablet Take 500 mg by mouth every 6 (six) hours as needed for moderate pain.   Yes Historical Provider, MD  albuterol (PROVENTIL HFA;VENTOLIN HFA) 108 (90 BASE) MCG/ACT inhaler Inhale 1-2 puffs into the lungs every 6 (six) hours as needed for wheezing or shortness of breath. 07/22/15  Yes Latrelle Dodrill, MD  cetirizine (ZYRTEC) 10 MG tablet Take 10 mg by mouth daily as needed for allergies.  Yes Historical Provider, MD  Prenatal Vit-Fe Fumarate-FA (MULTIVITAMIN-PRENATAL) 27-0.8 MG TABS tablet Take 1 tablet by mouth daily at 12 noon.   Yes Historical Provider, MD    Family History Family History  Problem Relation Age of Onset  . Bipolar disorder Mother   . Hypertension Mother   . Hypertension Other   . Depression Maternal Aunt   . Hypothyroidism Maternal Aunt   . Cancer Maternal Aunt     breast cancer in multiple aunts  . Depression Maternal Uncle   . Depression Maternal Grandmother   . Hyperthyroidism Maternal Grandmother   .  Sjogren's syndrome Maternal Grandmother   . Asthma Maternal Grandmother   . Diabetes Maternal Grandfather   . Hypertension Maternal Grandfather   . Cancer Maternal Grandfather     pancreatic   . Anesthesia problems Neg Hx     Social History Social History  Substance Use Topics  . Smoking status: Never Smoker  . Smokeless tobacco: Never Used  . Alcohol use Yes     Comment: ocassionally     Allergies   Patient has no known allergies.   Review of Systems Review of Systems  Musculoskeletal: Positive for myalgias (left lower leg).  Skin: Positive for color change (redness).  All other systems reviewed and are negative.    Physical Exam Updated Vital Signs BP 121/76   Pulse 85   Temp 97.8 F (36.6 C) (Oral)   Resp 18   Ht  (1.702 m)   Wt 97.5 kg   SpO2 99%   BMI 33.67 kg/m   Physical Exam  Constitutional: She is oriented to person, place, and time. She appears well-developed and well-nourished. No distress.  HENT:  Head: Normocephalic and atraumatic.  Eyes: Conjunctivae and EOM are normal. Right eye exhibits no discharge. Left eye exhibits no discharge. No scleral icterus.  Neck: Normal range of motion. Neck supple.  Cardiovascular: Normal rate, regular rhythm, normal heart sounds and intact distal pulses.   Pulmonary/Chest: Effort normal and breath sounds normal. No respiratory distress. She has no wheezes. She has no rales. She exhibits no tenderness.  Abdominal: Soft. Bowel sounds are normal. She exhibits no distension. There is no tenderness.  Musculoskeletal: Normal range of motion. She exhibits tenderness. She exhibits no edema or deformity.       Left knee: She exhibits normal range of motion, no swelling, no effusion, no ecchymosis, no deformity, no laceration, normal alignment, no LCL laxity, normal patellar mobility, no bony tenderness, normal meniscus and no MCL laxity.       Legs: TTP over soft tissue of left posterior/medial upper calf, knee and  distal thigh with small area of erythema present. + Homan's sign. No warmth, swelling, induration or fluctuance present. No calf swelling. FROM of left foot, ankle, knee and hip with 5/5 strength. 2+ DP pulses. Sensation intact.   Neurological: She is alert and oriented to person, place, and time. No sensory deficit.  Skin: Skin is warm and dry. She is not diaphoretic.  Nursing note and vitals reviewed.    ED Treatments / Results  Labs (all labs ordered are listed, but only abnormal results are displayed) Labs Reviewed - No data to display  EKG  EKG Interpretation None       Radiology No results found.  Procedures Procedures (including critical care time)  Medications Ordered in ED Medications - No data to display   Initial Impression / Assessment and Plan / ED Course  I have reviewed the triage  vital signs and the nursing notes.  Pertinent labs & imaging results that were available during my care of the patient were reviewed by me and considered in my medical decision making (see chart for details).     Patient presents with left lower leg pain has gradually worsened over the past 5 days. Denies any recent fall, trauma or injury. Reports being on Clomid the end of March and states she flew to [facility] while on the medication and is concerned about blood clots, family history of blood clots. Denies fever, chest pain or shortness of breath. VSS. Exam showed TTP over soft tissue of left posterior/medial upper calf, knee and distal thigh with small area of erythema present. + Homan's sign. Remaining exam unremarkable. Knee exam otherwise negative, no bony tenderness, no TTP over posterior knee, no palpable baker's cyst. No evidence suggesting infection, cellulitis or septic joint. We'll order a venous study for further evaluation of DVT. Left lower extremity venous duplex negative for DVT. Suspect sxs may be due to musculoskeletal etiology. Discussed results and plan for  discharge with patient. Plan to discharge patient home with NSAIDs, cryotherapy and symptomatic treatment. Advised patient to follow up with PCP if symptoms have not improved in the next 4-5 days. Discussed return precautions.  Final Clinical Impressions(s) / ED Diagnoses   Final diagnoses:  Left leg pain    New Prescriptions New Prescriptions   No medications on file     Barrett Henle, [place] 02/01/17 0941    Azalia Bilis, MD 02/03/17 [phone]

## 2017-02-01 NOTE — ED Triage Notes (Signed)
Per pt has had lower left leg pain off and on this week but tonight the pain moved to behind the knee and above slightly.  Noticed a "red streak" and is concerned about blood clots with her family history.

## 2017-02-01 NOTE — ED Notes (Signed)
Pt reports she doesn't want anything for pain. Verbalized she would tell this RN if that changed.

## 2017-02-01 NOTE — ED Notes (Signed)
Pt taken to Vascular

## 2017-02-01 NOTE — Progress Notes (Signed)
VASCULAR LAB PRELIMINARY  PRELIMINARY  PRELIMINARY  PRELIMINARY  Left lower extremity venous duplex completed.    Preliminary report:  There is no DVT or SVT noted in the left lower extremity.  There are enlarged inguinal lymph nodes noted.  Called results to Dr. Johann Capers, [employer], RVT 02/01/2017, 9:18 AM

## 2017-04-26 ENCOUNTER — Encounter: Payer: BLUE CROSS/BLUE SHIELD | Admitting: Internal Medicine

## 2019-02-18 ENCOUNTER — Telehealth: Payer: Self-pay

## 2019-02-18 NOTE — Telephone Encounter (Signed)
Called and spoke to patient to schedule and appointment.     Patient states that she has moved out of state and does not have a PCP as of yet in her new location.  Glennie Hawk, CMA

## 2022-03-14 ENCOUNTER — Encounter: Payer: Self-pay | Admitting: *Deleted

## 2024-01-27 ENCOUNTER — Ambulatory Visit (HOSPITAL_COMMUNITY)
Admission: EM | Admit: 2024-01-27 | Discharge: 2024-01-27 | Disposition: A | Discharge status: Home / Self Care | Attending: Family Medicine | Admitting: Family Medicine

## 2024-01-27 ENCOUNTER — Encounter (HOSPITAL_COMMUNITY): Payer: Self-pay

## 2024-01-27 DIAGNOSIS — R600 Localized edema: Secondary | ICD-10-CM

## 2024-01-27 MED ORDER — HYDROCHLOROTHIAZIDE 25 MG PO TABS: 25.0000 mg | ORAL_TABLET | Freq: Every day | ORAL | 0 refills | Status: AC

## 2024-01-27 NOTE — Discharge Instructions (Signed)
 Hydrochlorothiazide  25 mg--1 daily for swelling for 2 days.  Drink plenty of water, and eat less sodium.  Eat plenty of fruit  Follow-up with your primary care

## 2024-01-27 NOTE — ED Provider Notes (Signed)
 [facility]    CSN: [id number] Arrival date & time: 01/27/24  1544      History   Chief Complaint Chief Complaint  Patient presents with   Joint Swelling    HPI Katie Dyer is a 38 y.o. female.   HPI Here for swelling in her ankles.  She drove here for about 12 hours a few days ago.  She has had swelling in both ankles since then.  It does get better when she has been asleep but then will get worse after she has been up again.  She did eat a good bit of fast food on the road on the way here.  No shortness of breath and no cough.  She did just have a recent blood work with her primary care and she shows me the CMP values.  Her creatinine was normal at that time.  She has had swelling in her ankles previously Past Medical History:  Diagnosis Date   Asthma    Depression 2003   Insomnia 2010   Intestinal polyps    Maternal iron  deficiency anemia 12/28/2015   Menorrhagia 09/19/2011   MENSTRUATION, PAINFUL 12/06/2006   Qualifier: Diagnosis of  By: WATT, [name]     Postoperative anemia due to acute blood loss 12/28/2015   Postpartum care following cesarean delivery (3/19) 12/27/2015   RECTAL POLYPS 07/25/2007   Qualifier: Diagnosis of  By: Arabella Beach MD, Matthew      Patient Active Problem List   Diagnosis Date Noted   Left wrist pain 11/06/2016   Rectal bleeding 03/24/2016   Maternal iron  deficiency anemia 12/28/2015   Postoperative anemia due to acute blood loss 12/28/2015   Postpartum care following cesarean delivery (3/19) 12/27/2015   Mild preeclampsia 12/26/2015   Dysmenorrhea 10/07/2014   Acute pharyngitis 10/28/2013   Back pain with radiation 06/19/2013   Axillary lymphadenopathy 05/06/2013   Eczema 09/16/2012   Skin tag 09/16/2012   Rash of face 09/16/2012   De Quervain's tenosynovitis, right 09/16/2012   Healthcare maintenance 06/07/2012   DEPRESSION 03/14/2010   ALLERGIC RHINITIS, SEASONAL 01/13/2010   Overweight(278.02) 12/06/2006   ASTHMA,  PERSISTENT 12/06/2006    Past Surgical History:  Procedure Laterality Date   CERVICAL POLYPECTOMY     CESAREAN SECTION N/A 12/26/2015   Procedure: CESAREAN SECTION;  Surgeon: Meriam Stamp, MD;  Location: [facility];  Service: Obstetrics;  Laterality: N/A;   COLONOSCOPY     TONSILLECTOMY      OB History     Gravida  1   Para  1   Term  1   Preterm  0   AB  0   Living  1      SAB  0   IAB  0   Ectopic  0   Multiple  0   Live Births  1            Home Medications    Prior to Admission medications   Medication Sig Start Date End Date Taking? Authorizing Provider  albuterol  (PROVENTIL  HFA;VENTOLIN  HFA) 108 (90 BASE) MCG/ACT inhaler Inhale 1-2 puffs into the lungs every 6 (six) hours as needed for wheezing or shortness of breath. 07/22/15  Yes Candee Cha, MD  hydrochlorothiazide  (HYDRODIURIL ) 25 MG tablet Take 1 tablet (25 mg total) by mouth daily. For swelling 01/27/24  Yes Yasmine Kilbourne K, MD  Vitamin D, Ergocalciferol, (DRISDOL) 1.25 MG (50000 UNIT) CAPS capsule Take 50,000 Units by mouth once a week. 01/16/24  Yes [provider]    Family History Family History  Problem Relation Age of Onset   Bipolar disorder Mother    Hypertension Mother    Hypertension Other    Depression Maternal Aunt    Hypothyroidism Maternal Aunt    Cancer Maternal Aunt        breast cancer in multiple aunts   Depression Maternal Uncle    Depression Maternal Grandmother    Hyperthyroidism Maternal Grandmother    Sjogren's syndrome Maternal Grandmother    Asthma Maternal Grandmother    Diabetes Maternal Grandfather    Hypertension Maternal Grandfather    Cancer Maternal Grandfather        pancreatic    Anesthesia problems Neg Hx     Social History Social History   Tobacco Use   Smoking status: Never   Smokeless tobacco: Never  Vaping Use   Vaping status: Never Used  Substance Use Topics   Alcohol use: Yes    Comment: ocassionally   Drug use: No      Allergies   Patient has no known allergies.   Review of Systems Review of Systems   Physical Exam Triage Vital Signs ED Triage Vitals  Encounter Vitals Group     BP 01/27/24 1642 (!) 151/97     Systolic BP Percentile --      Diastolic BP Percentile --      Pulse Rate 01/27/24 1642 (!) 103     Resp 01/27/24 1642 17     Temp 01/27/24 1642 98 F (36.7 C)     Temp Source 01/27/24 1642 Oral     SpO2 01/27/24 1642 96 %     Weight 01/27/24 1640 220 lb (99.8 kg)     Height 01/27/24 1640 5\' 6"  (1.676 m)     Head Circumference --      Peak Flow --      Pain Score 01/27/24 1640 0     Pain Loc --      Pain Education --      Exclude from Growth Chart --    No data found.  Updated Vital Signs BP (!) 151/97 (BP Location: Left Arm)   Pulse (!) 103   Temp 98 F (36.7 C) (Oral)   Resp 17   Ht 5\' 6"  (1.676 m)   Wt 99.8 kg   LMP 01/23/2024   SpO2 96%   BMI 35.51 kg/m   Visual Acuity Right Eye Distance:   Left Eye Distance:   Bilateral Distance:    Right Eye Near:   Left Eye Near:    Bilateral Near:     Physical Exam Vitals reviewed.  Constitutional:      General: She is not in acute distress.    Appearance: She is not ill-appearing, toxic-appearing or diaphoretic.  HENT:     Mouth/Throat:     Mouth: Mucous membranes are moist.  Cardiovascular:     Rate and Rhythm: Normal rate and regular rhythm.  Pulmonary:     Effort: Pulmonary effort is normal.     Breath sounds: Normal breath sounds.  Musculoskeletal:     Comments: There is 1+ edema of the ankles.  She has had some compression socks on and he can tell that they have pushed the edema up and so the edema is better on her lower legs and it is just below the knee.  There is no erythema or induration and there is no Homans or calf tenderness.   Skin:    Coloration:  Skin is not jaundiced or pale.  Neurological:     Mental Status: She is alert and oriented to person, place, and time.  Psychiatric:         Behavior: Behavior normal.      UC Treatments / Results  Labs (all labs ordered are listed, but only abnormal results are displayed) Labs Reviewed - No data to display  EKG   Radiology No results found.  Procedures Procedures (including critical care time)  Medications Ordered in UC Medications - No data to display  Initial Impression / Assessment and Plan / UC Course  I have reviewed the triage vital signs and the nursing notes.  Pertinent labs & imaging results that were available during my care of the patient were reviewed by me and considered in my medical decision making (see chart for details).     Since she has renal function testing that was normal in the last month, I am not going to repeat any lab work today.  2-day supply of hydrochlorothiazide  are sent in for her to take in the next couple of days.  We discussed that long trips with her knees that can cause the edema plus extra salt intake can contribute.   Final Clinical Impressions(s) / UC Diagnoses   Final diagnoses:  Pedal edema     Discharge Instructions      Hydrochlorothiazide  25 mg--1 daily for swelling for 2 days.  Drink plenty of water, and eat less sodium.  Eat plenty of fruit  Follow-up with your primary care     ED Prescriptions     Medication Sig Dispense Auth. Provider   hydrochlorothiazide  (HYDRODIURIL ) 25 MG tablet Take 1 tablet (25 mg total) by mouth daily. For swelling 2 tablet Ellsworth Haas, Amel Kitch K, MD      PDMP not reviewed this encounter.   Ann Keto, MD 01/27/24 [phone]

## 2024-01-27 NOTE — ED Triage Notes (Signed)
 Pt states that she has some bilateral ankle swelling. X2 days
